# Patient Record
Sex: Female | Born: 1950 | Hispanic: No | Marital: Married | Smoking: Never smoker
Health system: Southern US, Community
[De-identification: ages and names within clinical notes are randomized; demographics above are authoritative.]

## PROBLEM LIST (undated history)

## (undated) DIAGNOSIS — B019 Varicella without complication: Secondary | ICD-10-CM

## (undated) DIAGNOSIS — C210 Malignant neoplasm of anus, unspecified: Secondary | ICD-10-CM

## (undated) HISTORY — DX: Varicella without complication: B01.9

## (undated) HISTORY — DX: Malignant neoplasm of anus, unspecified: C21.0

## (undated) HISTORY — PX: TUBAL LIGATION: SHX77

---

## 2000-06-19 ENCOUNTER — Other Ambulatory Visit: Admission: RE | Admit: 2000-06-19 | Discharge: 2000-06-19 | Payer: Self-pay | Admitting: Internal Medicine

## 2013-11-25 ENCOUNTER — Encounter: Payer: Self-pay | Admitting: Physician Assistant

## 2013-11-25 ENCOUNTER — Ambulatory Visit (INDEPENDENT_AMBULATORY_CARE_PROVIDER_SITE_OTHER): Payer: BC Managed Care – PPO | Admitting: Physician Assistant

## 2013-11-25 VITALS — BP 102/72 | HR 73 | Temp 97.7°F | Ht 62.0 in | Wt 130.0 lb

## 2013-11-25 DIAGNOSIS — Z136 Encounter for screening for cardiovascular disorders: Secondary | ICD-10-CM

## 2013-11-25 DIAGNOSIS — Z0000 Encounter for general adult medical examination without abnormal findings: Secondary | ICD-10-CM

## 2013-11-25 DIAGNOSIS — Z23 Encounter for immunization: Secondary | ICD-10-CM

## 2013-11-25 NOTE — Patient Instructions (Signed)
It was nice to meet you and your family today.  Labs have been ordered. Please be fasting when you arrive to lab.  Referral for Mammogram, GYN and colonoscopy have been placed.    Health Maintenance, Female A healthy lifestyle and preventative care can promote health and wellness.  Maintain regular health, dental, and eye exams.  Eat a healthy diet. Foods like vegetables, fruits, whole grains, low-fat dairy products, and lean protein foods contain the nutrients you need without too many calories. Decrease your intake of foods high in solid fats, added sugars, and salt. Get information about a proper diet from your caregiver, if necessary.  Regular physical exercise is one of the most important things you can do for your health. Most adults should get at least 150 minutes of moderate-intensity exercise (any activity that increases your heart rate and causes you to sweat) each week. In addition, most adults need muscle-strengthening exercises on 2 or more days a week.   Maintain a healthy weight. The body mass index (BMI) is a screening tool to identify possible weight problems. It provides an estimate of body fat based on height and weight. Your caregiver can help determine your BMI, and can help you achieve or maintain a healthy weight. For adults 20 years and older:  A BMI below 18.5 is considered underweight.  A BMI of 18.5 to 24.9 is normal.  A BMI of 25 to 29.9 is considered overweight.  A BMI of 30 and above is considered obese.  Maintain normal blood lipids and cholesterol by exercising and minimizing your intake of saturated fat. Eat a balanced diet with plenty of fruits and vegetables. Blood tests for lipids and cholesterol should begin at age 65 and be repeated every 5 years. If your lipid or cholesterol levels are high, you are over 50, or you are a high risk for heart disease, you may need your cholesterol levels checked more frequently.Ongoing high lipid and cholesterol  levels should be treated with medicines if diet and exercise are not effective.  If you smoke, find out from your caregiver how to quit. If you do not use tobacco, do not start.  Lung cancer screening is recommended for adults aged 20 80 years who are at high risk for developing lung cancer because of a history of smoking. Yearly low-dose computed tomography (CT) is recommended for people who have at least a 30-pack-year history of smoking and are a current smoker or have quit within the past 15 years. A pack year of smoking is smoking an average of 1 pack of cigarettes a day for 1 year (for example: 1 pack a day for 30 years or 2 packs a day for 15 years). Yearly screening should continue until the smoker has stopped smoking for at least 15 years. Yearly screening should also be stopped for people who develop a health problem that would prevent them from having lung cancer treatment.  If you are pregnant, do not drink alcohol. If you are breastfeeding, be very cautious about drinking alcohol. If you are not pregnant and choose to drink alcohol, do not exceed 1 drink per day. One drink is considered to be 12 ounces (355 mL) of beer, 5 ounces (148 mL) of wine, or 1.5 ounces (44 mL) of liquor.  Avoid use of street drugs. Do not share needles with anyone. Ask for help if you need support or instructions about stopping the use of drugs.  High blood pressure causes heart disease and increases the risk of  stroke. Blood pressure should be checked at least every 1 to 2 years. Ongoing high blood pressure should be treated with medicines, if weight loss and exercise are not effective.  If you are 47 to 62 years old, ask your caregiver if you should take aspirin to prevent strokes.  Diabetes screening involves taking a blood sample to check your fasting blood sugar level. This should be done once every 3 years, after age 66, if you are within normal weight and without risk factors for diabetes. Testing should be  considered at a younger age or be carried out more frequently if you are overweight and have at least 1 risk factor for diabetes.  Breast cancer screening is essential preventative care for women. You should practice "breast self-awareness." This means understanding the normal appearance and feel of your breasts and may include breast self-examination. Any changes detected, no matter how small, should be reported to a caregiver. Women in their 66s and 30s should have a clinical breast exam (CBE) by a caregiver as part of a regular health exam every 1 to 3 years. After age 68, women should have a CBE every year. Starting at age 58, women should consider having a mammogram (breast X-ray) every year. Women who have a family history of breast cancer should talk to their caregiver about genetic screening. Women at a high risk of breast cancer should talk to their caregiver about having an MRI and a mammogram every year.  Breast cancer gene (BRCA)-related cancer risk assessment is recommended for women who have family members with BRCA-related cancers. BRCA-related cancers include breast, ovarian, tubal, and peritoneal cancers. Having family members with these cancers may be associated with an increased risk for harmful changes (mutations) in the breast cancer genes BRCA1 and BRCA2. Results of the assessment will determine the need for genetic counseling and BRCA1 and BRCA2 testing.  The Pap test is a screening test for cervical cancer. Women should have a Pap test starting at age 70. Between ages 7 and 37, Pap tests should be repeated every 2 years. Beginning at age 89, you should have a Pap test every 3 years as long as the past 3 Pap tests have been normal. If you had a hysterectomy for a problem that was not cancer or a condition that could lead to cancer, then you no longer need Pap tests. If you are between ages 65 and 61, and you have had normal Pap tests going back 10 years, you no longer need Pap tests. If  you have had past treatment for cervical cancer or a condition that could lead to cancer, you need Pap tests and screening for cancer for at least 20 years after your treatment. If Pap tests have been discontinued, risk factors (such as a new sexual partner) need to be reassessed to determine if screening should be resumed. Some women have medical problems that increase the chance of getting cervical cancer. In these cases, your caregiver may recommend more frequent screening and Pap tests.  The human papillomavirus (HPV) test is an additional test that may be used for cervical cancer screening. The HPV test looks for the virus that can cause the cell changes on the cervix. The cells collected during the Pap test can be tested for HPV. The HPV test could be used to screen women aged 70 years and older, and should be used in women of any age who have unclear Pap test results. After the age of 38, women should have HPV testing  at the same frequency as a Pap test.  Colorectal cancer can be detected and often prevented. Most routine colorectal cancer screening begins at the age of 74 and continues through age 38. However, your caregiver may recommend screening at an earlier age if you have risk factors for colon cancer. On a yearly basis, your caregiver may provide home test kits to check for hidden blood in the stool. Use of a small camera at the end of a tube, to directly examine the colon (sigmoidoscopy or colonoscopy), can detect the earliest forms of colorectal cancer. Talk to your caregiver about this at age 54, when routine screening begins. Direct examination of the colon should be repeated every 5 to 10 years through age 40, unless early forms of pre-cancerous polyps or small growths are found.  Hepatitis C blood testing is recommended for all people born from [birth year] through 1964 and any individual with known risks for hepatitis C.  Practice safe sex. Use condoms and avoid high-risk sexual practices to  reduce the spread of sexually transmitted infections (STIs). Sexually active women aged 85 and younger should be checked for Chlamydia, which is a common sexually transmitted infection. Older women with new or multiple partners should also be tested for Chlamydia. Testing for other STIs is recommended if you are sexually active and at increased risk.  Osteoporosis is a disease in which the bones lose minerals and strength with aging. This can result in serious bone fractures. The risk of osteoporosis can be identified using a bone density scan. Women ages 53 and over and women at risk for fractures or osteoporosis should discuss screening with their caregivers. Ask your caregiver whether you should be taking a calcium supplement or vitamin D to reduce the rate of osteoporosis.  Menopause can be associated with physical symptoms and risks. Hormone replacement therapy is available to decrease symptoms and risks. You should talk to your caregiver about whether hormone replacement therapy is right for you.  Use sunscreen. Apply sunscreen liberally and repeatedly throughout the day. You should seek shade when your shadow is shorter than you. Protect yourself by wearing long sleeves, pants, a wide-brimmed hat, and sunglasses year round, whenever you are outdoors.  Notify your caregiver of new moles or changes in moles, especially if there is a change in shape or color. Also notify your caregiver if a mole is larger than the size of a pencil eraser.  Stay current with your immunizations. Document Released: 05/08/2011 Document Revised: 02/17/2013 Document Reviewed: 05/08/2011 [facility] Patient Information 2014 [place].

## 2013-11-26 NOTE — Progress Notes (Signed)
Subjective:    Patient ID: Anna Lawrence, female    DOB: 04/25/1951, 63 y.o.   MRN: [id number]  HPI Comments: Patient is a 63 year old female who presents to the office today to establish care. Patient is accompanied by her son who is Optometrist for encounter. Patient is from [place]. Patient reports no concerns today, would like to have referral for gynecology and have labs done. Works as a Biomedical scientist at [employer].  Patient denies chronic diagnosis or use of medications. Denies recent history of fever, chest pain, palpitations, LE swelling, cough, SOB, headaches, lightheaded/dizziness, visual change/disturbance or change in bowel/bladder habits.    Review of Systems  Constitutional: Negative for fever and chills.  HENT: Negative for trouble swallowing.   Eyes: Negative for pain and visual disturbance.  Respiratory: Negative for cough and shortness of breath.   Cardiovascular: Negative for chest pain, palpitations and leg swelling.  Genitourinary: Negative.   Musculoskeletal: Positive for myalgias (left arm intermittent when lifting plates at work.).  Skin: Negative for rash.  Neurological: Negative for dizziness, weakness, light-headedness and headaches.  All other systems reviewed and are negative.      Objective:   Physical Exam  Constitutional: Vital signs are normal. She appears well-developed and well-nourished. No distress.  HENT:  Head: Normocephalic and atraumatic.  Right Ear: Tympanic membrane, external ear and ear canal normal.  Left Ear: Tympanic membrane, external ear and ear canal normal.  Nose: Nose normal.  Mouth/Throat: Uvula is midline, oropharynx is clear and moist and mucous membranes are normal.  Unable to hear finger rubbing bilateral - at baseline per patient.  Eyes: Conjunctivae and EOM are normal. Pupils are equal, round, and reactive to light.  Neck: Trachea normal and normal range of motion. No thyromegaly present.  Cardiovascular: Normal rate  and regular rhythm.   Pulses:      Radial pulses are 2+ on the right side, and 2+ on the left side.       Dorsalis pedis pulses are 2+ on the right side, and 2+ on the left side.  Pulmonary/Chest: Breath sounds normal. She has no wheezes. She has no rhonchi. She has no rales.  Abdominal: Soft. Normal appearance and bowel sounds are normal. There is no hepatosplenomegaly. There is no tenderness.  Genitourinary:  Deferred to GYN  Musculoskeletal:  FROM U/LE bilateral  Lymphadenopathy:    She has no cervical adenopathy.       Right: No supraclavicular adenopathy present.       Left: No supraclavicular adenopathy present.  Neurological: She is alert. She has normal strength. No cranial nerve deficit. Coordination and gait normal.  Skin: Skin is warm and dry.  Psychiatric: She has a normal mood and affect. Her speech is normal and behavior is normal.   History reviewed. No pertinent past medical history. History reviewed. No pertinent family history. History   Social History  . Marital Status: Married    Spouse Name: N/A    Number of Children: N/A  . Years of Education: N/A   Social History Main Topics  . Smoking status: Never Smoker   . Smokeless tobacco: None  . Alcohol Use: No  . Drug Use: No  . Sexual Activity: None   Other Topics Concern  . None   Social History Narrative  . None    Assessment & Plan:    CPX/v70.0 - Patient has been counseled on age-appropriate routine health concerns for screening and prevention. These are reviewed and up-to-date. Immunizations  are up-to-date or declined. Labs ordered and will be reviewed.   Health Maintenance: Influenza vaccine today Tetanus vaccine today Referral to gastroenterology for colonoscopy Referral to GYN per patient request Mammogram

## 2013-12-18 ENCOUNTER — Ambulatory Visit
Admission: RE | Admit: 2013-12-18 | Discharge: 2013-12-18 | Disposition: A | Discharge status: Home / Self Care | Payer: BC Managed Care – PPO | Source: Ambulatory Visit | Attending: Physician Assistant | Admitting: Physician Assistant

## 2013-12-18 DIAGNOSIS — Z0000 Encounter for general adult medical examination without abnormal findings: Secondary | ICD-10-CM

## 2014-01-15 ENCOUNTER — Encounter: Payer: BC Managed Care – PPO | Admitting: Obstetrics & Gynecology

## 2015-04-26 ENCOUNTER — Ambulatory Visit: Payer: Self-pay | Admitting: Family

## 2015-06-21 ENCOUNTER — Telehealth: Payer: Self-pay | Admitting: Behavioral Health

## 2015-06-21 NOTE — Telephone Encounter (Signed)
Unable to reach patient at time of Pre-Visit Call.  Left message for patient to return call when available.    

## 2015-06-22 ENCOUNTER — Ambulatory Visit (INDEPENDENT_AMBULATORY_CARE_PROVIDER_SITE_OTHER): Payer: 59 | Admitting: Physician Assistant

## 2015-06-22 ENCOUNTER — Encounter: Payer: Self-pay | Admitting: Physician Assistant

## 2015-06-22 ENCOUNTER — Ambulatory Visit (HOSPITAL_BASED_OUTPATIENT_CLINIC_OR_DEPARTMENT_OTHER)
Admission: RE | Admit: 2015-06-22 | Discharge: 2015-06-22 | Disposition: A | Discharge status: Home / Self Care | Payer: 59 | Source: Ambulatory Visit | Attending: Physician Assistant | Admitting: Physician Assistant

## 2015-06-22 VITALS — BP 140/80 | HR 90 | Temp 98.2°F | Ht 62.0 in | Wt 131.6 lb

## 2015-06-22 DIAGNOSIS — R1900 Intra-abdominal and pelvic swelling, mass and lump, unspecified site: Secondary | ICD-10-CM

## 2015-06-22 DIAGNOSIS — Z1211 Encounter for screening for malignant neoplasm of colon: Secondary | ICD-10-CM | POA: Diagnosis not present

## 2015-06-22 DIAGNOSIS — H9191 Unspecified hearing loss, right ear: Secondary | ICD-10-CM | POA: Diagnosis not present

## 2015-06-22 DIAGNOSIS — R2242 Localized swelling, mass and lump, left lower limb: Secondary | ICD-10-CM | POA: Diagnosis present

## 2015-06-22 DIAGNOSIS — M47896 Other spondylosis, lumbar region: Secondary | ICD-10-CM | POA: Insufficient documentation

## 2015-06-22 DIAGNOSIS — R42 Dizziness and giddiness: Secondary | ICD-10-CM

## 2015-06-22 NOTE — Progress Notes (Signed)
Pre visit review using our clinic review tool, if applicable. No additional management support is needed unless otherwise documented below in the visit note. 

## 2015-06-22 NOTE — Progress Notes (Signed)
   Patient presents to clinic today to establish care.  Acute Concerns: Patient notes and episode of lightheadedness last week that occurred when trying to get out of bed. States she felt lightheaded, hot and sweaty. Had to sit still for about 5 minutes for lightheadedness to subside. Denies dizziness, fever, chills, nausea, vision change or AMS. Denies having another episode of this issue.  Patient also complains of a hard knot in her left pelvic region that "comes and goes". States it is noticed with prolonged standing. Denies history of hernia. Denies pain or tenderness.  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  Past Medical History  Diagnosis Date  . Chicken pox     Past Surgical History  Procedure Laterality Date  . Tubal ligation      No current outpatient prescriptions on file prior to visit.   No current facility-administered medications on file prior to visit.    No Known Allergies  Family History  Problem Relation Age of Onset  . Diabetes Mother   . Hypertension Mother     Social History   Social History  . Marital Status: Married    Spouse Name: N/A  . Number of Children: N/A  . Years of Education: N/A   Occupational History  . Not on file.   Social History Main Topics  . Smoking status: Never Smoker   . Smokeless tobacco: Never Used  . Alcohol Use: No  . Drug Use: No  . Sexual Activity: Not Currently   Other Topics Concern  . Not on file   Social History Narrative   Review of Systems  Constitutional: Negative for fever and malaise/fatigue.  Eyes: Negative for blurred vision and double vision.  Respiratory: Negative for shortness of breath.   Cardiovascular: Negative for chest pain and palpitations.  Gastrointestinal: Negative for abdominal pain.  Musculoskeletal: Negative for myalgias.  Neurological: Negative for dizziness, sensory change, speech change, focal weakness and loss of  consciousness.    BP 140/80 mmHg  Pulse 90  Temp(Src) 98.2 F (36.8 C) (Oral)  Ht 5\' 2"  (1.575 m)  Wt 131 lb 9.6 oz (59.693 kg)  BMI 24.06 kg/m2  SpO2 98%  Physical Exam  Constitutional: She is oriented to person, place, and time and well-developed, well-nourished, and in no distress.  HENT:  Head: Normocephalic and atraumatic.  Eyes: Conjunctivae are normal. Pupils are equal, round, and reactive to light.  Neck: Neck supple.  Cardiovascular: Normal rate, regular rhythm, normal heart sounds and intact distal pulses.   Pulmonary/Chest: Effort normal and breath sounds normal. No respiratory distress. She has no wheezes. She has no rales. She exhibits no tenderness.  Abdominal:    Neurological: She is alert and oriented to person, place, and time.  Skin: Skin is warm and dry. No rash noted.  Psychiatric: Affect normal.  Vitals reviewed.    Assessment/Plan: Colon cancer screening Referral to GI placed for screening colonoscopy as patient overdue.  Episodic lightheadedness One episode. Resting BP stable in clinic. OVS +. Increase fluid intake, recommend Propel once daily. Discussed slow rising and position changes. Will follow-up at CPE.  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  Pelvic mass in female Odd feeling on palpation. Some concern for hernia, but mass feels almost like bony abnormality of pelvic bone. Will start with x-ray to assess.

## 2015-06-22 NOTE — Patient Instructions (Addendum)
Please go downstairs for x-ray. I will call you with your results. You will be contacted for a screening colonoscopy and hearing evaluation. Stay well hydrated and eat a well-balanced diet. Add a gatorade daily to diet to jeep blood pressure stable. Your exam and vitals look good today. Hopefully this was a one-time episode.  Follow-up ASAP for a physical. syncope. Special exercises or compression stockings may be recommended. In rare cases, the surgical placement of a pacemaker is considered.  Near-Syncope Near-syncope (commonly known as near fainting) is sudden weakness, dizziness, or feeling like you might pass out. This can happen when getting up or while standing for a long time. It is caused by a sudden decrease in blood flow to the brain, which can occur for various reasons. Most of the reasons are not serious.  HOME CARE Watch your condition for any changes.  Have someone stay with you until you feel stable.  If you feel like you are going to pass out:  Lie down right away.  Prop your feet up if you can.  Breathe deeply and steadily.  Move only when the feeling has gone away. Most of the time, this feeling lasts only a few minutes. You may feel tired for several hours.  Drink enough fluids to keep your pee (urine) clear or pale yellow.  If you are taking blood pressure or heart medicine, stand up slowly.  Follow up with your doctor as told. GET HELP RIGHT AWAY IF:   You have a severe headache.  You have unusual pain in the chest, belly (abdomen), or back.  You have bleeding from the mouth or butt (rectum), or you have black or tarry poop (stool).  You feel your heart beat differently than normal, or you have a very fast pulse.  You pass out, or you twitch and shake when you pass out.  You pass out when sitting or lying down.  You feel confused.  You have trouble walking.  You are weak.  You have vision problems. MAKE SURE YOU:   Understand these  instructions.  Will watch your condition.  Will get help right away if you are not doing well or get worse. Document Released: 04/10/2008 Document Revised: 10/28/2013 Document Reviewed: 03/28/2013 [facility] Patient Information 2015 [place], [place]. This information is not intended to replace advice given to you by your health care provider. Make sure you discuss any questions you have with your health care provider.

## 2015-06-23 ENCOUNTER — Telehealth: Payer: Self-pay | Admitting: Physician Assistant

## 2015-06-23 NOTE — Telephone Encounter (Signed)
pre visit letter mailed 06/16/15  °

## 2015-06-26 DIAGNOSIS — H9191 Unspecified hearing loss, right ear: Secondary | ICD-10-CM | POA: Insufficient documentation

## 2015-06-26 DIAGNOSIS — R1900 Intra-abdominal and pelvic swelling, mass and lump, unspecified site: Secondary | ICD-10-CM | POA: Insufficient documentation

## 2015-06-26 DIAGNOSIS — R42 Dizziness and giddiness: Secondary | ICD-10-CM | POA: Insufficient documentation

## 2015-06-26 DIAGNOSIS — Z1211 Encounter for screening for malignant neoplasm of colon: Secondary | ICD-10-CM | POA: Insufficient documentation

## 2015-06-26 NOTE — Assessment & Plan Note (Signed)
Referral to GI placed for screening colonoscopy as patient overdue.

## 2015-06-26 NOTE — Assessment & Plan Note (Signed)
One episode. Resting BP stable in clinic. OVS +. Increase fluid intake, recommend Propel once daily. Discussed slow rising and position changes. Will follow-up at CPE.

## 2015-06-26 NOTE — Assessment & Plan Note (Signed)
Odd feeling on palpation. Some concern for hernia, but mass feels almost like bony abnormality of pelvic bone. Will start with x-ray to assess.

## 2015-06-26 NOTE — Assessment & Plan Note (Signed)
Referral to Audiology placed

## 2015-07-02 ENCOUNTER — Telehealth: Payer: Self-pay | Admitting: *Deleted

## 2015-07-02 ENCOUNTER — Encounter: Payer: Self-pay | Admitting: *Deleted

## 2015-07-02 NOTE — Telephone Encounter (Signed)
-----   Message from [name], PA-C sent at 06/23/2015  7:46 AM EDT ----- Patient needs translator service to receive results. X-ray unremarkable meaning hard mass is not bony in nature. Recommend CT abdomen/pelvis to further assess this mass. Let me know if patient is willing and I will order.

## 2015-07-07 ENCOUNTER — Ambulatory Visit (INDEPENDENT_AMBULATORY_CARE_PROVIDER_SITE_OTHER): Payer: 59 | Admitting: Physician Assistant

## 2015-07-07 ENCOUNTER — Encounter: Payer: Self-pay | Admitting: Physician Assistant

## 2015-07-07 VITALS — BP 124/78 | HR 67 | Temp 97.6°F | Resp 16 | Ht 62.0 in | Wt 129.5 lb

## 2015-07-07 DIAGNOSIS — Z1239 Encounter for other screening for malignant neoplasm of breast: Secondary | ICD-10-CM | POA: Diagnosis not present

## 2015-07-07 DIAGNOSIS — R8290 Unspecified abnormal findings in urine: Secondary | ICD-10-CM | POA: Diagnosis not present

## 2015-07-07 DIAGNOSIS — R1900 Intra-abdominal and pelvic swelling, mass and lump, unspecified site: Secondary | ICD-10-CM

## 2015-07-07 DIAGNOSIS — Z0000 Encounter for general adult medical examination without abnormal findings: Secondary | ICD-10-CM

## 2015-07-07 LAB — COMPREHENSIVE METABOLIC PANEL
ALBUMIN: 4.2 g/dL (ref 3.5–5.2)
ALK PHOS: 52 U/L (ref 39–117)
ALT: 14 U/L (ref 0–35)
AST: 19 U/L (ref 0–37)
BILIRUBIN TOTAL: 0.6 mg/dL (ref 0.2–1.2)
BUN: 17 mg/dL (ref 6–23)
CALCIUM: 9.3 mg/dL (ref 8.4–10.5)
CO2: 29 mEq/L (ref 19–32)
CREATININE: 0.91 mg/dL (ref 0.40–1.20)
Chloride: 107 mEq/L (ref 96–112)
GFR: 66.38 mL/min (ref >60.00)
Glucose, Bld: 88 mg/dL (ref 70–99)
Potassium: 4.1 mEq/L (ref 3.5–5.1)
SODIUM: 142 meq/L (ref 135–145)
TOTAL PROTEIN: 7.5 g/dL (ref 6.0–8.3)

## 2015-07-07 LAB — LIPID PANEL
Cholesterol: 172 mg/dL (ref 0–200)
HDL: 48.9 mg/dL (ref >39.00)
LDL CALC: 108 mg/dL — AB (ref 0–99)
NONHDL: 123.55
Total CHOL/HDL Ratio: 4
Triglycerides: 77 mg/dL (ref 0.0–149.0)
VLDL: 15.4 mg/dL (ref 0.0–40.0)

## 2015-07-07 LAB — URINALYSIS, ROUTINE W REFLEX MICROSCOPIC
Bilirubin Urine: NEGATIVE
Ketones, ur: NEGATIVE
Leukocytes, UA: NEGATIVE
Nitrite: NEGATIVE
PH: 6 (ref 5.0–8.0)
SPECIFIC GRAVITY, URINE: 1.01 (ref 1.000–1.030)
TOTAL PROTEIN, URINE-UPE24: NEGATIVE
UROBILINOGEN UA: 0.2 (ref 0.0–1.0)
Urine Glucose: NEGATIVE

## 2015-07-07 LAB — CBC
HCT: 35.7 % — ABNORMAL LOW (ref 36.0–46.0)
Hemoglobin: 11.8 g/dL — ABNORMAL LOW (ref 12.0–15.0)
MCHC: 33 g/dL (ref 30.0–36.0)
MCV: 90.7 fl (ref 78.0–100.0)
PLATELETS: 196 10*3/uL (ref 150.0–400.0)
RBC: 3.93 Mil/uL (ref 3.87–5.11)
RDW: 13 % (ref 11.5–15.5)
WBC: 2.8 10*3/uL — AB (ref 4.0–10.5)

## 2015-07-07 LAB — VITAMIN D 25 HYDROXY (VIT D DEFICIENCY, FRACTURES): VITD: 29.64 ng/mL — ABNORMAL LOW (ref 30.00–100.00)

## 2015-07-07 LAB — HEPATITIS C ANTIBODY: HCV Ab: NEGATIVE

## 2015-07-07 NOTE — Addendum Note (Signed)
Addended by: Raiford Noble on: 07/07/2015 07:56 AM   Modules accepted: Orders, SmartSet

## 2015-07-07 NOTE — Assessment & Plan Note (Signed)
Order for screening mammogram placed.  

## 2015-07-07 NOTE — Assessment & Plan Note (Signed)
Will proceed with CT pelvis.

## 2015-07-07 NOTE — Assessment & Plan Note (Signed)
Depression screen negative. Health Maintenance reviewed -- Will obtain Hep C screening. Colonoscopy order placed. Declines Zostavax. Will get screening mammogram. FLu shot given.. Preventive schedule discussed and handout given in AVS. Will obtain fasting labs today.

## 2015-07-07 NOTE — Progress Notes (Signed)
Patient presents to clinic today for annual exam.  Patient is fasting for labs. Translator present for entirety of visit.  Acute Concerns: None today per patient.  Chronic Issues: Pelvic Mass -- X-ray unremarkable. Is still present after prolonged walking or standing. Patient denies new or worsening symptoms.  Health Maintenance: Dental -- up-to-date Vision -- up-to-date Immunizations -- Flu shot will be given today. Declines Zostavax. Colonoscopy -- Referral placed at last visit for screening colonoscopy. Mammogram -- Last in 12/2013. Due for repeat. Will order.   Past Medical History  Diagnosis Date  . Chicken pox     Past Surgical History  Procedure Laterality Date  . Tubal ligation      Current Outpatient Prescriptions on File Prior to Visit  Medication Sig Dispense Refill  . Omega-3 Fatty Acids (FISH OIL PO) Take by mouth. Take 1 capsule by mouth 2 times daily.     No current facility-administered medications on file prior to visit.    No Known Allergies  Family History  Problem Relation Age of Onset  . Diabetes Mother   . Hypertension Mother     Social History   Social History  . Marital Status: Married    Spouse Name: N/A  . Number of Children: N/A  . Years of Education: N/A   Occupational History  . Not on file.   Social History Main Topics  . Smoking status: Never Smoker   . Smokeless tobacco: Never Used  . Alcohol Use: No  . Drug Use: No  . Sexual Activity: Not Currently   Other Topics Concern  . Not on file   Social History Narrative   Review of Systems  Constitutional: Negative for fever and weight loss.  HENT: Positive for hearing loss. Negative for ear discharge, ear pain and tinnitus.   Eyes: Negative for blurred vision, double vision, photophobia and pain.  Respiratory: Negative for cough and shortness of breath.   Cardiovascular: Negative for chest pain and palpitations.  Gastrointestinal: Negative for heartburn, nausea,  vomiting, abdominal pain, diarrhea, constipation, blood in stool and melena.  Genitourinary: Negative for dysuria, urgency, frequency, hematuria and flank pain.  Musculoskeletal: Negative for falls.  Neurological: Negative for dizziness, loss of consciousness and headaches.  Endo/Heme/Allergies: Negative for environmental allergies.  Psychiatric/Behavioral: Negative for depression, suicidal ideas, hallucinations and substance abuse. The patient is not nervous/anxious and does not have insomnia.    BP 124/78 mmHg  Pulse 67  Temp(Src) 97.6 F (36.4 C) (Oral)  Resp 16  Ht 5\' 2"  (1.575 m)  Wt 129 lb 8 oz (58.741 kg)  BMI 23.68 kg/m2  SpO2 99%  Physical Exam  Constitutional: She is oriented to person, place, and time and well-developed, well-nourished, and in no distress.  HENT:  Head: Normocephalic and atraumatic.  Right Ear: External ear normal.  Left Ear: External ear normal.  Nose: Nose normal.  Mouth/Throat: Oropharynx is clear and moist. No oropharyngeal exudate.  TM within normal limits bilaterally.  Eyes: Conjunctivae are normal.  Neck: Neck supple.  Cardiovascular: Normal rate, regular rhythm, normal heart sounds and intact distal pulses.   Pulmonary/Chest: Effort normal and breath sounds normal. No respiratory distress. She has no wheezes. She has no rales. She exhibits no tenderness.  Neurological: She is alert and oriented to person, place, and time.  Skin: Skin is warm and dry. No rash noted.  Psychiatric: Affect normal.  Vitals reviewed.  Assessment/Plan: Visit for preventive health examination Depression screen negative. Health Maintenance reviewed -- Will obtain  Hep C screening. Colonoscopy order placed. Declines Zostavax. Will get screening mammogram. FLu shot given.. Preventive schedule discussed and handout given in AVS. Will obtain fasting labs today.   Pelvic mass in female Will proceed with CT pelvis.  Breast cancer screening Order for screening  mammogram placed.

## 2015-07-07 NOTE — Addendum Note (Signed)
Addended by: Harl Bowie on: 07/07/2015 03:00 PM   Modules accepted: Orders

## 2015-07-07 NOTE — Addendum Note (Signed)
Addended by: Raiford Noble on: 07/07/2015 08:44 AM   Modules accepted: Orders, SmartSet

## 2015-07-07 NOTE — Patient Instructions (Signed)
Please go to the lab for blood work.  I will call you with your results. If your blood work is normal we will follow-up yearly for physicals.  If anything is abnormal we will treat you and get you back in for a visit.  Please keep your phone on:   -- You will be called for mammogram, colonoscopy, hearing assessment and for your CT scan.  Preventive Care for Adults A healthy lifestyle and preventive care can promote health and wellness. Preventive health guidelines for women include the following key practices.  A routine yearly physical is a good way to check with your health care provider about your health and preventive screening. It is a chance to share any concerns and updates on your health and to receive a thorough exam.  Visit your dentist for a routine exam and preventive care every 6 months. Brush your teeth twice a day and floss once a day. Good oral hygiene prevents tooth decay and gum disease.  The frequency of eye exams is based on your age, health, family medical history, use of contact lenses, and other factors. Follow your health care provider's recommendations for frequency of eye exams.  Eat a healthy diet. Foods like vegetables, fruits, whole grains, low-fat dairy products, and lean protein foods contain the nutrients you need without too many calories. Decrease your intake of foods high in solid fats, added sugars, and salt. Eat the right amount of calories for you.Get information about a proper diet from your health care provider, if necessary.  Regular physical exercise is one of the most important things you can do for your health. Most adults should get at least 150 minutes of moderate-intensity exercise (any activity that increases your heart rate and causes you to sweat) each week. In addition, most adults need muscle-strengthening exercises on 2 or more days a week.  Maintain a healthy weight. The body mass index (BMI) is a screening tool to identify possible weight  problems. It provides an estimate of body fat based on height and weight. Your health care provider can find your BMI and can help you achieve or maintain a healthy weight.For adults 20 years and older:  A BMI below 18.5 is considered underweight.  A BMI of 18.5 to 24.9 is normal.  A BMI of 25 to 29.9 is considered overweight.  A BMI of 30 and above is considered obese.  Maintain normal blood lipids and cholesterol levels by exercising and minimizing your intake of saturated fat. Eat a balanced diet with plenty of fruit and vegetables. Blood tests for lipids and cholesterol should begin at age 58 and be repeated every 5 years. If your lipid or cholesterol levels are high, you are over 50, or you are at high risk for heart disease, you may need your cholesterol levels checked more frequently.Ongoing high lipid and cholesterol levels should be treated with medicines if diet and exercise are not working.  If you smoke, find out from your health care provider how to quit. If you do not use tobacco, do not start.  Lung cancer screening is recommended for adults aged 35-80 years who are at high risk for developing lung cancer because of a history of smoking. A yearly low-dose CT scan of the lungs is recommended for people who have at least a 30-pack-year history of smoking and are a current smoker or have quit within the past 15 years. A pack year of smoking is smoking an average of 1 pack of cigarettes a day  for 1 year (for example: 1 pack a day for 30 years or 2 packs a day for 15 years). Yearly screening should continue until the smoker has stopped smoking for at least 15 years. Yearly screening should be stopped for people who develop a health problem that would prevent them from having lung cancer treatment.  If you are pregnant, do not drink alcohol. If you are breastfeeding, be very cautious about drinking alcohol. If you are not pregnant and choose to drink alcohol, do not have more than 1 drink  per day. One drink is considered to be 12 ounces (355 mL) of beer, 5 ounces (148 mL) of wine, or 1.5 ounces (44 mL) of liquor.  Avoid use of street drugs. Do not share needles with anyone. Ask for help if you need support or instructions about stopping the use of drugs.  High blood pressure causes heart disease and increases the risk of stroke. Your blood pressure should be checked at least every 1 to 2 years. Ongoing high blood pressure should be treated with medicines if weight loss and exercise do not work.  If you are 79-73 years old, ask your health care provider if you should take aspirin to prevent strokes.  Diabetes screening involves taking a blood sample to check your fasting blood sugar level. This should be done once every 3 years, after age 28, if you are within normal weight and without risk factors for diabetes. Testing should be considered at a younger age or be carried out more frequently if you are overweight and have at least 1 risk factor for diabetes.  Breast cancer screening is essential preventive care for women. You should practice "breast self-awareness." This means understanding the normal appearance and feel of your breasts and may include breast self-examination. Any changes detected, no matter how small, should be reported to a health care provider. Women in their 56s and 30s should have a clinical breast exam (CBE) by a health care provider as part of a regular health exam every 1 to 3 years. After age 2, women should have a CBE every year. Starting at age 15, women should consider having a mammogram (breast X-ray test) every year. Women who have a family history of breast cancer should talk to their health care provider about genetic screening. Women at a high risk of breast cancer should talk to their health care providers about having an MRI and a mammogram every year.  Breast cancer gene (BRCA)-related cancer risk assessment is recommended for women who have family  members with BRCA-related cancers. BRCA-related cancers include breast, ovarian, tubal, and peritoneal cancers. Having family members with these cancers may be associated with an increased risk for harmful changes (mutations) in the breast cancer genes BRCA1 and BRCA2. Results of the assessment will determine the need for genetic counseling and BRCA1 and BRCA2 testing.  Routine pelvic exams to screen for cancer are no longer recommended for nonpregnant women who are considered low risk for cancer of the pelvic organs (ovaries, uterus, and vagina) and who do not have symptoms. Ask your health care provider if a screening pelvic exam is right for you.  If you have had past treatment for cervical cancer or a condition that could lead to cancer, you need Pap tests and screening for cancer for at least 20 years after your treatment. If Pap tests have been discontinued, your risk factors (such as having a new sexual partner) need to be reassessed to determine if screening should be resumed.  Some women have medical problems that increase the chance of getting cervical cancer. In these cases, your health care provider may recommend more frequent screening and Pap tests.  The HPV test is an additional test that may be used for cervical cancer screening. The HPV test looks for the virus that can cause the cell changes on the cervix. The cells collected during the Pap test can be tested for HPV. The HPV test could be used to screen women aged 63 years and older, and should be used in women of any age who have unclear Pap test results. After the age of 42, women should have HPV testing at the same frequency as a Pap test.  Colorectal cancer can be detected and often prevented. Most routine colorectal cancer screening begins at the age of 74 years and continues through age 82 years. However, your health care provider may recommend screening at an earlier age if you have risk factors for colon cancer. On a yearly basis,  your health care provider may provide home test kits to check for hidden blood in the stool. Use of a small camera at the end of a tube, to directly examine the colon (sigmoidoscopy or colonoscopy), can detect the earliest forms of colorectal cancer. Talk to your health care provider about this at age 53, when routine screening begins. Direct exam of the colon should be repeated every 5-10 years through age 8 years, unless early forms of pre-cancerous polyps or small growths are found.  People who are at an increased risk for hepatitis B should be screened for this virus. You are considered at high risk for hepatitis B if:  You were born in a country where hepatitis B occurs often. Talk with your health care provider about which countries are considered high risk.  Your parents were born in a high-risk country and you have not received a shot to protect against hepatitis B (hepatitis B vaccine).  You have HIV or AIDS.  You use needles to inject street drugs.  You live with, or have sex with, someone who has hepatitis B.  You get hemodialysis treatment.  You take certain medicines for conditions like cancer, organ transplantation, and autoimmune conditions.  Hepatitis C blood testing is recommended for all people born from [birth year] through 1964 and any individual with known risks for hepatitis C.  Practice safe sex. Use condoms and avoid high-risk sexual practices to reduce the spread of sexually transmitted infections (STIs). STIs include gonorrhea, chlamydia, syphilis, trichomonas, herpes, HPV, and human immunodeficiency virus (HIV). Herpes, HIV, and HPV are viral illnesses that have no cure. They can result in disability, cancer, and death.  You should be screened for sexually transmitted illnesses (STIs) including gonorrhea and chlamydia if:  You are sexually active and are younger than 24 years.  You are older than 24 years and your health care provider tells you that you are at risk for  this type of infection.  Your sexual activity has changed since you were last screened and you are at an increased risk for chlamydia or gonorrhea. Ask your health care provider if you are at risk.  If you are at risk of being infected with HIV, it is recommended that you take a prescription medicine daily to prevent HIV infection. This is called preexposure prophylaxis (PrEP). You are considered at risk if:  You are a heterosexual woman, are sexually active, and are at increased risk for HIV infection.  You take drugs by injection.  You are  sexually active with a partner who has HIV.  Talk with your health care provider about whether you are at high risk of being infected with HIV. If you choose to begin PrEP, you should first be tested for HIV. You should then be tested every 3 months for as long as you are taking PrEP.  Osteoporosis is a disease in which the bones lose minerals and strength with aging. This can result in serious bone fractures or breaks. The risk of osteoporosis can be identified using a bone density scan. Women ages 38 years and over and women at risk for fractures or osteoporosis should discuss screening with their health care providers. Ask your health care provider whether you should take a calcium supplement or vitamin D to reduce the rate of osteoporosis.  Menopause can be associated with physical symptoms and risks. Hormone replacement therapy is available to decrease symptoms and risks. You should talk to your health care provider about whether hormone replacement therapy is right for you.  Use sunscreen. Apply sunscreen liberally and repeatedly throughout the day. You should seek shade when your shadow is shorter than you. Protect yourself by wearing long sleeves, pants, a wide-brimmed hat, and sunglasses year round, whenever you are outdoors.  Once a month, do a whole body skin exam, using a mirror to look at the skin on your back. Tell your health care provider of  new moles, moles that have irregular borders, moles that are larger than a pencil eraser, or moles that have changed in shape or color.  Stay current with required vaccines (immunizations).  Influenza vaccine. All adults should be immunized every year.  Tetanus, diphtheria, and acellular pertussis (Td, Tdap) vaccine. Pregnant women should receive 1 dose of Tdap vaccine during each pregnancy. The dose should be obtained regardless of the length of time since the last dose. Immunization is preferred during the 27th-36th week of gestation. An adult who has not previously received Tdap or who does not know her vaccine status should receive 1 dose of Tdap. This initial dose should be followed by tetanus and diphtheria toxoids (Td) booster doses every 10 years. Adults with an unknown or incomplete history of completing a 3-dose immunization series with Td-containing vaccines should begin or complete a primary immunization series including a Tdap dose. Adults should receive a Td booster every 10 years.  Varicella vaccine. An adult without evidence of immunity to varicella should receive 2 doses or a second dose if she has previously received 1 dose. Pregnant females who do not have evidence of immunity should receive the first dose after pregnancy. This first dose should be obtained before leaving the health care facility. The second dose should be obtained 4-8 weeks after the first dose.  Human papillomavirus (HPV) vaccine. Females aged 13-26 years who have not received the vaccine previously should obtain the 3-dose series. The vaccine is not recommended for use in pregnant females. However, pregnancy testing is not needed before receiving a dose. If a female is found to be pregnant after receiving a dose, no treatment is needed. In that case, the remaining doses should be delayed until after the pregnancy. Immunization is recommended for any person with an immunocompromised condition through the age of 61  years if she did not get any or all doses earlier. During the 3-dose series, the second dose should be obtained 4-8 weeks after the first dose. The third dose should be obtained 24 weeks after the first dose and 16 weeks after the second dose.  Zoster vaccine. One dose is recommended for adults aged 55 years or older unless certain conditions are present.  Measles, mumps, and rubella (MMR) vaccine. Adults born before [birth year] generally are considered immune to measles and mumps. Adults born in [birth year] or later should have 1 or more doses of MMR vaccine unless there is a contraindication to the vaccine or there is laboratory evidence of immunity to each of the three diseases. A routine second dose of MMR vaccine should be obtained at least 28 days after the first dose for students attending postsecondary schools, health care workers, or international travelers. People who received inactivated measles vaccine or an unknown type of measles vaccine during 1963-1967 should receive 2 doses of MMR vaccine. People who received inactivated mumps vaccine or an unknown type of mumps vaccine before 1979 and are at high risk for mumps infection should consider immunization with 2 doses of MMR vaccine. For females of childbearing age, rubella immunity should be determined. If there is no evidence of immunity, females who are not pregnant should be vaccinated. If there is no evidence of immunity, females who are pregnant should delay immunization until after pregnancy. Unvaccinated health care workers born before [birth year] who lack laboratory evidence of measles, mumps, or rubella immunity or laboratory confirmation of disease should consider measles and mumps immunization with 2 doses of MMR vaccine or rubella immunization with 1 dose of MMR vaccine.  Pneumococcal 13-valent conjugate (PCV13) vaccine. When indicated, a person who is uncertain of her immunization history and has no record of immunization should receive the PCV13 vaccine.  An adult aged 4 years or older who has certain medical conditions and has not been previously immunized should receive 1 dose of PCV13 vaccine. This PCV13 should be followed with a dose of pneumococcal polysaccharide (PPSV23) vaccine. The PPSV23 vaccine dose should be obtained at least 8 weeks after the dose of PCV13 vaccine. An adult aged 66 years or older who has certain medical conditions and previously received 1 or more doses of PPSV23 vaccine should receive 1 dose of PCV13. The PCV13 vaccine dose should be obtained 1 or more years after the last PPSV23 vaccine dose.  Pneumococcal polysaccharide (PPSV23) vaccine. When PCV13 is also indicated, PCV13 should be obtained first. All adults aged 27 years and older should be immunized. An adult younger than age 43 years who has certain medical conditions should be immunized. Any person who resides in a nursing home or long-term care facility should be immunized. An adult smoker should be immunized. People with an immunocompromised condition and certain other conditions should receive both PCV13 and PPSV23 vaccines. People with human immunodeficiency virus (HIV) infection should be immunized as soon as possible after diagnosis. Immunization during chemotherapy or radiation therapy should be avoided. Routine use of PPSV23 vaccine is not recommended for American Indians, [place] Natives, or people younger than 65 years unless there are medical conditions that require PPSV23 vaccine. When indicated, people who have unknown immunization and have no record of immunization should receive PPSV23 vaccine. One-time revaccination 5 years after the first dose of PPSV23 is recommended for people aged 19-64 years who have chronic kidney failure, nephrotic syndrome, asplenia, or immunocompromised conditions. People who received 1-2 doses of PPSV23 before age 31 years should receive another dose of PPSV23 vaccine at age 68 years or later if at least 5 years have passed since the  previous dose. Doses of PPSV23 are not needed for people immunized with PPSV23 at or after age 26 years.  Meningococcal  vaccine. Adults with asplenia or persistent complement component deficiencies should receive 2 doses of quadrivalent meningococcal conjugate (MenACWY-D) vaccine. The doses should be obtained at least 2 months apart. Microbiologists working with certain meningococcal bacteria, [employer] recruits, people at risk during an outbreak, and people who travel to or live in countries with a high rate of meningitis should be immunized. A first-year college student up through age 60 years who is living in a residence hall should receive a dose if she did not receive a dose on or after her 16th birthday. Adults who have certain high-risk conditions should receive one or more doses of vaccine.  Hepatitis A vaccine. Adults who wish to be protected from this disease, have certain high-risk conditions, work with hepatitis A-infected animals, work in hepatitis A research labs, or travel to or work in countries with a high rate of hepatitis A should be immunized. Adults who were previously unvaccinated and who anticipate close contact with an international adoptee during the first 60 days after arrival in the [place] from a country with a high rate of hepatitis A should be immunized.  Hepatitis B vaccine. Adults who wish to be protected from this disease, have certain high-risk conditions, may be exposed to blood or other infectious body fluids, are household contacts or sex partners of hepatitis B positive people, are clients or workers in certain care facilities, or travel to or work in countries with a high rate of hepatitis B should be immunized.  Haemophilus influenzae type b (Hib) vaccine. A previously unvaccinated person with asplenia or sickle cell disease or having a scheduled splenectomy should receive 1 dose of Hib vaccine. Regardless of previous immunization, a recipient of a hematopoietic  stem cell transplant should receive a 3-dose series 6-12 months after her successful transplant. Hib vaccine is not recommended for adults with HIV infection. Preventive Services / Frequency Ages 71 to 15 years  Blood pressure check.** / Every 1 to 2 years.  Lipid and cholesterol check.** / Every 5 years beginning at age 19.  Clinical breast exam.** / Every 3 years for women in their 64s and 79s.  BRCA-related cancer risk assessment.** / For women who have family members with a BRCA-related cancer (breast, ovarian, tubal, or peritoneal cancers).  Pap test.** / Every 2 years from ages 59 through 4. Every 3 years starting at age 42 through age 59 or 35 with a history of 3 consecutive normal Pap tests.  HPV screening.** / Every 3 years from ages 63 through ages 68 to 43 with a history of 3 consecutive normal Pap tests.  Hepatitis C blood test.** / For any individual with known risks for hepatitis C.  Skin self-exam. / Monthly.  Influenza vaccine. / Every year.  Tetanus, diphtheria, and acellular pertussis (Tdap, Td) vaccine.** / Consult your health care provider. Pregnant women should receive 1 dose of Tdap vaccine during each pregnancy. 1 dose of Td every 10 years.  Varicella vaccine.** / Consult your health care provider. Pregnant females who do not have evidence of immunity should receive the first dose after pregnancy.  HPV vaccine. / 3 doses over 6 months, if 33 and younger. The vaccine is not recommended for use in pregnant females. However, pregnancy testing is not needed before receiving a dose.  Measles, mumps, rubella (MMR) vaccine.** / You need at least 1 dose of MMR if you were born in 1957 or later. You may also need a 2nd dose. For females of childbearing age, rubella immunity should be  determined. If there is no evidence of immunity, females who are not pregnant should be vaccinated. If there is no evidence of immunity, females who are pregnant should delay immunization until  after pregnancy.  Pneumococcal 13-valent conjugate (PCV13) vaccine.** / Consult your health care provider.  Pneumococcal polysaccharide (PPSV23) vaccine.** / 1 to 2 doses if you smoke cigarettes or if you have certain conditions.  Meningococcal vaccine.** / 1 dose if you are age 49 to 62 years and a Market researcher living in a residence hall, or have one of several medical conditions, you need to get vaccinated against meningococcal disease. You may also need additional booster doses.  Hepatitis A vaccine.** / Consult your health care provider.  Hepatitis B vaccine.** / Consult your health care provider.  Haemophilus influenzae type b (Hib) vaccine.** / Consult your health care provider. Ages 23 to 39 years  Blood pressure check.** / Every 1 to 2 years.  Lipid and cholesterol check.** / Every 5 years beginning at age 27 years.  Lung cancer screening. / Every year if you are aged 4-80 years and have a 30-pack-year history of smoking and currently smoke or have quit within the past 15 years. Yearly screening is stopped once you have quit smoking for at least 15 years or develop a health problem that would prevent you from having lung cancer treatment.  Clinical breast exam.** / Every year after age 59 years.  BRCA-related cancer risk assessment.** / For women who have family members with a BRCA-related cancer (breast, ovarian, tubal, or peritoneal cancers).  Mammogram.** / Every year beginning at age 5 years and continuing for as long as you are in good health. Consult with your health care provider.  Pap test.** / Every 3 years starting at age 64 years through age 66 or 85 years with a history of 3 consecutive normal Pap tests.  HPV screening.** / Every 3 years from ages 1 years through ages 46 to 34 years with a history of 3 consecutive normal Pap tests.  Fecal occult blood test (FOBT) of stool. / Every year beginning at age 68 years and continuing until age 25 years.  You may not need to do this test if you get a colonoscopy every 10 years.  Flexible sigmoidoscopy or colonoscopy.** / Every 5 years for a flexible sigmoidoscopy or every 10 years for a colonoscopy beginning at age 30 years and continuing until age 39 years.  Hepatitis C blood test.** / For all people born from [birth year] through 1964 and any individual with known risks for hepatitis C.  Skin self-exam. / Monthly.  Influenza vaccine. / Every year.  Tetanus, diphtheria, and acellular pertussis (Tdap/Td) vaccine.** / Consult your health care provider. Pregnant women should receive 1 dose of Tdap vaccine during each pregnancy. 1 dose of Td every 10 years.  Varicella vaccine.** / Consult your health care provider. Pregnant females who do not have evidence of immunity should receive the first dose after pregnancy.  Zoster vaccine.** / 1 dose for adults aged 8 years or older.  Measles, mumps, rubella (MMR) vaccine.** / You need at least 1 dose of MMR if you were born in 1957 or later. You may also need a 2nd dose. For females of childbearing age, rubella immunity should be determined. If there is no evidence of immunity, females who are not pregnant should be vaccinated. If there is no evidence of immunity, females who are pregnant should delay immunization until after pregnancy.  Pneumococcal 13-valent conjugate (PCV13) vaccine.** /  Consult your health care provider.  Pneumococcal polysaccharide (PPSV23) vaccine.** / 1 to 2 doses if you smoke cigarettes or if you have certain conditions.  Meningococcal vaccine.** / Consult your health care provider.  Hepatitis A vaccine.** / Consult your health care provider.  Hepatitis B vaccine.** / Consult your health care provider.  Haemophilus influenzae type b (Hib) vaccine.** / Consult your health care provider. Ages 46 years and over  Blood pressure check.** / Every 1 to 2 years.  Lipid and cholesterol check.** / Every 5 years beginning at age 49  years.  Lung cancer screening. / Every year if you are aged 40-80 years and have a 30-pack-year history of smoking and currently smoke or have quit within the past 15 years. Yearly screening is stopped once you have quit smoking for at least 15 years or develop a health problem that would prevent you from having lung cancer treatment.  Clinical breast exam.** / Every year after age 8 years.  BRCA-related cancer risk assessment.** / For women who have family members with a BRCA-related cancer (breast, ovarian, tubal, or peritoneal cancers).  Mammogram.** / Every year beginning at age 16 years and continuing for as long as you are in good health. Consult with your health care provider.  Pap test.** / Every 3 years starting at age 87 years through age 38 or 30 years with 3 consecutive normal Pap tests. Testing can be stopped between 65 and 70 years with 3 consecutive normal Pap tests and no abnormal Pap or HPV tests in the past 10 years.  HPV screening.** / Every 3 years from ages 14 years through ages 23 or 16 years with a history of 3 consecutive normal Pap tests. Testing can be stopped between 65 and 70 years with 3 consecutive normal Pap tests and no abnormal Pap or HPV tests in the past 10 years.  Fecal occult blood test (FOBT) of stool. / Every year beginning at age 77 years and continuing until age 22 years. You may not need to do this test if you get a colonoscopy every 10 years.  Flexible sigmoidoscopy or colonoscopy.** / Every 5 years for a flexible sigmoidoscopy or every 10 years for a colonoscopy beginning at age 51 years and continuing until age 10 years.  Hepatitis C blood test.** / For all people born from [birth year] through 1964 and any individual with known risks for hepatitis C.  Osteoporosis screening.** / A one-time screening for women ages 86 years and over and women at risk for fractures or osteoporosis.  Skin self-exam. / Monthly.  Influenza vaccine. / Every year.  Tetanus,  diphtheria, and acellular pertussis (Tdap/Td) vaccine.** / 1 dose of Td every 10 years.  Varicella vaccine.** / Consult your health care provider.  Zoster vaccine.** / 1 dose for adults aged 35 years or older.  Pneumococcal 13-valent conjugate (PCV13) vaccine.** / Consult your health care provider.  Pneumococcal polysaccharide (PPSV23) vaccine.** / 1 dose for all adults aged 35 years and older.  Meningococcal vaccine.** / Consult your health care provider.  Hepatitis A vaccine.** / Consult your health care provider.  Hepatitis B vaccine.** / Consult your health care provider.  Haemophilus influenzae type b (Hib) vaccine.** / Consult your health care provider. ** Family history and personal history of risk and conditions may change your health care provider's recommendations. Document Released: 12/19/2001 Document Revised: 03/09/2014 Document Reviewed: 03/20/2011 [facility] Patient Information 2015 [place], [place]. This information is not intended to replace advice given to you by your health  care provider. Make sure you discuss any questions you have with your health care provider.

## 2015-07-07 NOTE — Progress Notes (Signed)
Pre visit review using our clinic review tool, if applicable. No additional management support is needed unless otherwise documented below in the visit note/SLS  

## 2015-07-08 NOTE — Telephone Encounter (Signed)
Pt was seen in the office on (07/07/15) for CPE, and was given x-ray results and CT ordered.//AB/CMA

## 2015-07-10 LAB — URINE CULTURE

## 2015-07-16 ENCOUNTER — Telehealth: Payer: Self-pay | Admitting: *Deleted

## 2015-07-16 MED ORDER — NITROFURANTOIN MONOHYD MACRO 100 MG PO CAPS
100.0000 mg | ORAL_CAPSULE | Freq: Two times a day (BID) | ORAL | Status: DC
Start: 2015-07-16 — End: 2015-09-17

## 2015-07-16 NOTE — Telephone Encounter (Signed)
Notes Recorded by Rockwell Germany, CMA on 07/13/2015 at 7:59 AM [facility] with contact name and number for return call RE: results and further provider instructions/SLS

## 2015-07-16 NOTE — Telephone Encounter (Signed)
LMOM [2nd] with contact name and number for return call RE: detailed message of results and further provider instructions; did state that medication was being sent to her pharmacy, as it is the weekend & we have yet to reach patient via telephone/SLS

## 2015-07-16 NOTE — Telephone Encounter (Signed)
-----   Message from [name], PA-C sent at 07/12/2015  8:56 PM EDT ----- Labs are great overall. Her Vitamin D is just under the normal range. Nothing needed but 10-15 minutes of sunlight each day! Her urine showed signs of infection so we sent for culture that confirmed a UTI. Will need to treat. Ok to send in Rx for Macrobid 100 mg BID x 5 days. Quantity 10 with 0 refills.

## 2015-07-19 ENCOUNTER — Encounter: Payer: Self-pay | Admitting: *Deleted

## 2015-07-20 NOTE — Telephone Encounter (Signed)
We have made attempts to reach patient by phone unsuccessfully; Letter Mailed/SLS

## 2015-07-26 ENCOUNTER — Ambulatory Visit (HOSPITAL_BASED_OUTPATIENT_CLINIC_OR_DEPARTMENT_OTHER): Payer: 59

## 2015-09-17 ENCOUNTER — Encounter: Payer: Self-pay | Admitting: Physician Assistant

## 2015-09-17 ENCOUNTER — Ambulatory Visit (INDEPENDENT_AMBULATORY_CARE_PROVIDER_SITE_OTHER): Payer: 59 | Admitting: Physician Assistant

## 2015-09-17 VITALS — BP 153/80 | HR 78 | Temp 97.9°F | Resp 16 | Ht 62.0 in | Wt 133.2 lb

## 2015-09-17 DIAGNOSIS — K648 Other hemorrhoids: Secondary | ICD-10-CM | POA: Diagnosis not present

## 2015-09-17 MED ORDER — HYDROCORTISONE ACETATE 25 MG RE SUPP: 25.0000 mg | Freq: Two times a day (BID) | RECTAL | Status: DC

## 2015-09-17 NOTE — Assessment & Plan Note (Signed)
Palpable on exam. Grade 2 suspected. Non-thrombosed. Rx Hydrocortisone rectal suppositories BID x 6 days. Dietary measures and fiber supplement discussed. Follow-up if not resolving.

## 2015-09-17 NOTE — Progress Notes (Signed)
Pre visit review using our clinic review tool, if applicable. No additional management support is needed unless otherwise documented below in the visit note/SLS  

## 2015-09-17 NOTE — Progress Notes (Signed)
Patient presents to clinic today c/o lump inside rectum that is pruritic but not painful. Endorses some mild discomfort with bowel movements. Endorses one episode of bright red blood when wiping. Denies melena. Endorses some mild constipation recently.  Past Medical History  Diagnosis Date  . Chicken pox     Current Outpatient Prescriptions on File Prior to Visit  Medication Sig Dispense Refill  . Omega-3 Fatty Acids (FISH OIL PO) Take by mouth. Take 1 capsule by mouth 2 times daily.     No current facility-administered medications on file prior to visit.    No Known Allergies  Family History  Problem Relation Age of Onset  . Diabetes Mother   . Hypertension Mother     Social History   Social History  . Marital Status: Married    Spouse Name: N/A  . Number of Children: N/A  . Years of Education: N/A   Social History Main Topics  . Smoking status: Never Smoker   . Smokeless tobacco: Never Used  . Alcohol Use: No  . Drug Use: No  . Sexual Activity: Not Currently   Other Topics Concern  . None   Social History Narrative   Review of Systems - See HPI.  All other ROS are negative.  BP 153/80 mmHg  Pulse 78  Temp(Src) 97.9 F (36.6 C) (Oral)  Resp 16  Ht _0  (1.575 m)  Wt 133 lb 4 oz (60.442 kg)  BMI 24.37 kg/m2  SpO2 100%  Physical Exam  Constitutional: She is oriented to person, place, and time and well-developed, well-nourished, and in no distress.  HENT:  Head: Normocephalic and atraumatic.  Cardiovascular: Normal rate, regular rhythm, normal heart sounds and intact distal pulses.   Pulmonary/Chest: Effort normal.  Genitourinary: Rectal exam shows internal hemorrhoid. Rectal exam shows no external hemorrhoid and no fissure. Guaiac negative stool.  Chaperone present for examination  Neurological: She is alert and oriented to person, place, and time.  Skin: Skin is warm and dry. No rash noted.  Vitals reviewed.   Recent Results (from the past 2160  hour(s))  CBC     Status: Abnormal   Collection Time: 07/07/15  7:57 AM  Result Value Ref Range   WBC 2.8 (L) 4.0 - 10.5 K/uL   RBC 3.93 3.87 - 5.11 Mil/uL   Platelets 196.0 150.0 - 400.0 K/uL   Hemoglobin 11.8 (L) 12.0 - 15.0 g/dL   HCT 35.7 (L) 36.0 - 46.0 %   MCV 90.7 78.0 - 100.0 fl   MCHC 33.0 30.0 - 36.0 g/dL   RDW 13.0 11.5 - 15.5 %  Comp Met (CMET)     Status: None   Collection Time: 07/07/15  7:57 AM  Result Value Ref Range   Sodium 142 135 - 145 mEq/L   Potassium 4.1 3.5 - 5.1 mEq/L   Chloride 107 96 - 112 mEq/L   CO2 29 19 - 32 mEq/L   Glucose, Bld 88 70 - 99 mg/dL   BUN 17 6 - 23 mg/dL   Creatinine, Ser 0.91 0.40 - 1.20 mg/dL   Total Bilirubin 0.6 0.2 - 1.2 mg/dL   Alkaline Phosphatase 52 39 - 117 U/L   AST 19 0 - 37 U/L   ALT 14 0 - 35 U/L   Total Protein 7.5 6.0 - 8.3 g/dL   Albumin 4.2 3.5 - 5.2 g/dL   Calcium 9.3 8.4 - 10.5 mg/dL   GFR 66.38 >60.00 mL/min  Urinalysis, Routine w reflex microscopic (  not at [facility])     Status: Abnormal   Collection Time: 07/07/15  7:57 AM  Result Value Ref Range   Color, Urine YELLOW Yellow;Lt. Yellow   APPearance CLEAR Clear   Specific Gravity, Urine 1.010 1.000-1.030   pH 6.0 5.0 - 8.0   Total Protein, Urine NEGATIVE Negative   Urine Glucose NEGATIVE Negative   Ketones, ur NEGATIVE Negative   Bilirubin Urine NEGATIVE Negative   Hgb urine dipstick MODERATE (A) Negative   Urobilinogen, UA 0.2 0.0 - 1.0   Leukocytes, UA NEGATIVE Negative   Nitrite NEGATIVE Negative   WBC, UA 0-2/hpf 0-2/hpf   RBC / HPF 3-6/hpf (A) 0-2/hpf   Squamous Epithelial / LPF Rare(0-4/hpf) Rare(0-4/hpf)   Bacteria, UA Few(10-50/hpf) (A) None  Lipid Profile     Status: Abnormal   Collection Time: 07/07/15  7:57 AM  Result Value Ref Range   Cholesterol 172 0 - 200 mg/dL    Comment: ATP III Classification       Desirable:  < 200 mg/dL               Borderline High:  200 - 239 mg/dL          High:  > = 240 mg/dL   Triglycerides 77.0 0.0 - 149.0 mg/dL     Comment: Normal:  <150 mg/dLBorderline High:  150 - 199 mg/dL   HDL 48.90 >39.00 mg/dL   VLDL 15.4 0.0 - 40.0 mg/dL   LDL Cholesterol 108 (H) 0 - 99 mg/dL   Total CHOL/HDL Ratio 4     Comment:                Men          Women1/2 Average Risk     3.4          3.3Average Risk          5.0          4.42X Average Risk          9.6          7.13X Average Risk          15.0          11.0                       NonHDL 123.55     Comment: NOTE:  Non-HDL goal should be 30 mg/dL higher than patient's LDL goal (i.e. LDL goal of < 70 mg/dL, would have non-HDL goal of < 100 mg/dL)  Vitamin D (25 hydroxy)     Status: Abnormal   Collection Time: 07/07/15  7:57 AM  Result Value Ref Range   VITD 29.64 (L) 30.00 - 100.00 ng/mL  Hepatitis C Antibody     Status: None   Collection Time: 07/07/15  7:57 AM  Result Value Ref Range   HCV Ab NEGATIVE NEGATIVE  Urine Culture     Status: None   Collection Time: 07/07/15  3:00 PM  Result Value Ref Range   Culture ESCHERICHIA COLI    Colony Count >=100,000 COLONIES/ML    Organism ID, Bacteria ESCHERICHIA COLI       Susceptibility   Escherichia coli -  (no method available)    AMPICILLIN >=32 Resistant     AMOX/CLAVULANIC >=32 Resistant     AMPICILLIN/SULBACTAM >=32 Resistant     PIP/TAZO 64 Intermediate     IMIPENEM <=0.25 Sensitive     CEFAZOLIN 8 Resistant  CEFTRIAXONE <=1 Sensitive     CEFTAZIDIME <=1 Sensitive     CEFEPIME <=1 Sensitive     GENTAMICIN <=1 Sensitive     TOBRAMYCIN <=1 Sensitive     CIPROFLOXACIN <=0.25 Sensitive     LEVOFLOXACIN <=0.12 Sensitive     NITROFURANTOIN <=16 Sensitive     TRIMETH/SULFA* >=320 Resistant      * ORAL therapy:A cefazolin MIC of <32 predicts susceptibility to the oral agents cefaclor,cefdinir,cefpodoxime,cefprozil,cefuroxime,cephalexin,and loracarbef when used for therapy of uncomplicated UTIs due to E.coli,K.pneumomiae,and P.mirabilis. PARENTERAL therapy: A cefazolinMIC of >8 indicates resistance to  parenteralcefazolin. An alternate test method must beperformed to confirm susceptibility to parenteralcefazolin.    Assessment/Plan: Internal hemorrhoids Palpable on exam. Grade 2 suspected. Non-thrombosed. Rx Hydrocortisone rectal suppositories BID x 6 days. Dietary measures and fiber supplement discussed. Follow-up if not resolving.

## 2015-09-17 NOTE — Patient Instructions (Signed)
Please use rectal suppositories as directed for 6 days to shrink hemorrhoid. Increase fluids and start a fiber supplement daily. You can use wet wipes to help with wiping after bowel movements.  Call or come see me if symptoms are not resolving. We will need to set you up with a GI specialist.

## 2015-10-14 ENCOUNTER — Telehealth: Payer: Self-pay | Admitting: Physician Assistant

## 2015-10-14 NOTE — Telephone Encounter (Signed)
LM to schedule flu shot or update records.

## 2015-12-09 NOTE — Telephone Encounter (Signed)
LM for pt to call and schedule flu shot or update records. °

## 2016-07-10 ENCOUNTER — Encounter: Payer: Self-pay | Admitting: Family Medicine

## 2016-08-02 ENCOUNTER — Encounter: Payer: Self-pay | Admitting: Physician Assistant

## 2016-08-02 ENCOUNTER — Ambulatory Visit (INDEPENDENT_AMBULATORY_CARE_PROVIDER_SITE_OTHER): Payer: BLUE CROSS/BLUE SHIELD | Admitting: Physician Assistant

## 2016-08-02 VITALS — BP 141/87 | HR 79 | Temp 98.2°F | Resp 16 | Ht 62.0 in | Wt 129.2 lb

## 2016-08-02 DIAGNOSIS — Z0000 Encounter for general adult medical examination without abnormal findings: Secondary | ICD-10-CM

## 2016-08-02 DIAGNOSIS — Z23 Encounter for immunization: Secondary | ICD-10-CM

## 2016-08-02 DIAGNOSIS — Z1211 Encounter for screening for malignant neoplasm of colon: Secondary | ICD-10-CM

## 2016-08-02 DIAGNOSIS — Z1239 Encounter for other screening for malignant neoplasm of breast: Secondary | ICD-10-CM

## 2016-08-02 LAB — COMPREHENSIVE METABOLIC PANEL
ALBUMIN: 4.3 g/dL (ref 3.5–5.2)
ALK PHOS: 49 U/L (ref 39–117)
ALT: 14 U/L (ref 0–35)
AST: 19 U/L (ref 0–37)
BUN: 20 mg/dL (ref 6–23)
CO2: 27 mEq/L (ref 19–32)
CREATININE: 0.97 mg/dL (ref 0.40–1.20)
Calcium: 9.4 mg/dL (ref 8.4–10.5)
Chloride: 106 mEq/L (ref 96–112)
GFR: 61.46 mL/min (ref >60.00)
GLUCOSE: 80 mg/dL (ref 70–99)
POTASSIUM: 4.5 meq/L (ref 3.5–5.1)
SODIUM: 141 meq/L (ref 135–145)
TOTAL PROTEIN: 7.9 g/dL (ref 6.0–8.3)
Total Bilirubin: 0.4 mg/dL (ref 0.2–1.2)

## 2016-08-02 LAB — CBC
HEMATOCRIT: 35.5 % — AB (ref 36.0–46.0)
Hemoglobin: 12 g/dL (ref 12.0–15.0)
MCHC: 33.7 g/dL (ref 30.0–36.0)
MCV: 88.5 fl (ref 78.0–100.0)
Platelets: 215 10*3/uL (ref 150.0–400.0)
RBC: 4.02 Mil/uL (ref 3.87–5.11)
RDW: 13 % (ref 11.5–15.5)
WBC: 3.8 10*3/uL — AB (ref 4.0–10.5)

## 2016-08-02 LAB — TSH: TSH: 1.77 u[IU]/mL (ref 0.35–4.50)

## 2016-08-02 LAB — LIPID PANEL
CHOL/HDL RATIO: 4
Cholesterol: 191 mg/dL (ref 0–200)
HDL: 50.4 mg/dL (ref >39.00)
LDL Cholesterol: 123 mg/dL — ABNORMAL HIGH (ref 0–99)
NONHDL: 140.1
Triglycerides: 88 mg/dL (ref 0.0–149.0)
VLDL: 17.6 mg/dL (ref 0.0–40.0)

## 2016-08-02 LAB — URINALYSIS, ROUTINE W REFLEX MICROSCOPIC
Bilirubin Urine: NEGATIVE
Ketones, ur: NEGATIVE
Nitrite: POSITIVE — AB
PH: 6 (ref 5.0–8.0)
Total Protein, Urine: NEGATIVE
Urine Glucose: NEGATIVE
Urobilinogen, UA: 0.2 (ref 0.0–1.0)

## 2016-08-02 LAB — HEMOGLOBIN A1C: HEMOGLOBIN A1C: 5.7 % (ref 4.6–6.5)

## 2016-08-02 NOTE — Progress Notes (Signed)
Patient presents to clinic today for annual exam.  Patient is fasting for labs. Body mass index is 23.64 kg/m. Patient is eating a well-balanced diet and staying well hydrated. Is staying very active.    Health Maintenance: Immunizations -- Requests flu shot today.  Colonoscopy -- overdue. Mammogram -- overdue  Past Medical History:  Diagnosis Date  . Chicken pox     Past Surgical History:  Procedure Laterality Date  . TUBAL LIGATION      Current Outpatient Prescriptions on File Prior to Visit  Medication Sig Dispense Refill  . Omega-3 Fatty Acids (FISH OIL PO) Take by mouth. Take 1 capsule by mouth 2 times daily.     No current facility-administered medications on file prior to visit.     No Known Allergies  Family History  Problem Relation Age of Onset  . Diabetes Mother   . Hypertension Mother     Social History   Social History  . Marital status: Married    Spouse name: N/A  . Number of children: N/A  . Years of education: N/A   Occupational History  . Not on file.   Social History Main Topics  . Smoking status: Never Smoker  . Smokeless tobacco: Never Used  . Alcohol use No  . Drug use: No  . Sexual activity: Not Currently   Other Topics Concern  . Not on file   Social History Narrative  . No narrative on file   Review of Systems  Constitutional: Negative for fever and weight loss.  HENT: Negative for ear discharge, ear pain, hearing loss and tinnitus.   Eyes: Negative for blurred vision, double vision, photophobia and pain.  Respiratory: Negative for cough and shortness of breath.   Cardiovascular: Negative for chest pain and palpitations.  Gastrointestinal: Negative for abdominal pain, blood in stool, constipation, diarrhea, heartburn, melena, nausea and vomiting.  Genitourinary: Negative for dysuria, flank pain, frequency, hematuria and urgency.  Musculoskeletal: Negative for falls.  Neurological: Negative for dizziness, loss of  consciousness and headaches.  Endo/Heme/Allergies: Negative for environmental allergies.  Psychiatric/Behavioral: Negative for depression, hallucinations, substance abuse and suicidal ideas. The patient is not nervous/anxious and does not have insomnia.    BP (!) 141/87 (BP Location: Right Arm, Patient Position: Sitting, Cuff Size: Normal)   Pulse 79   Temp 98.2 F (36.8 C) (Oral)   Resp 16   Ht 5\' 2"  (1.575 m)   Wt 129 lb 4 oz (58.6 kg)   SpO2 99%   BMI 23.64 kg/m   Physical Exam  Constitutional: She is oriented to person, place, and time and well-developed, well-nourished, and in no distress.  HENT:  Head: Normocephalic and atraumatic.  Right Ear: Tympanic membrane, external ear and ear canal normal.  Left Ear: Tympanic membrane, external ear and ear canal normal.  Nose: Nose normal. No mucosal edema.  Mouth/Throat: Uvula is midline, oropharynx is clear and moist and mucous membranes are normal. No oropharyngeal exudate or posterior oropharyngeal erythema.  Eyes: Conjunctivae are normal. Pupils are equal, round, and reactive to light.  Neck: Neck supple. No thyromegaly present.  Cardiovascular: Normal rate, regular rhythm, normal heart sounds and intact distal pulses.   Pulmonary/Chest: Effort normal and breath sounds normal. No respiratory distress. She has no wheezes. She has no rales.  Abdominal: Soft. Bowel sounds are normal. She exhibits no distension and no mass. There is no tenderness. There is no rebound and no guarding.  Lymphadenopathy:    She has no cervical  adenopathy.  Neurological: She is alert and oriented to person, place, and time. No cranial nerve deficit.  Skin: Skin is warm and dry. No rash noted.  Psychiatric: Affect normal.  Vitals reviewed.  Assessment/Plan: 1. Visit for preventive health examination Depression screen negative. Health Maintenance reviewed -- Order for colonoscopy placed. Order for screening mammogram placed. Preventive schedule  discussed and handout given in AVS. Will obtain fasting labs today.  - CBC - Comprehensive metabolic panel - Hemoglobin A1c - Lipid panel - TSH - Urinalysis, Routine w reflex microscopic (not at [facility])  2. Encounter for immunization Flu shot given  3. Colon cancer screening Referral to GI placed for screening colonoscopy - Ambulatory referral to Gastroenterology  4. Breast cancer screening Order for screening mammogram placed - MM DIGITAL SCREENING BILATERAL; Future   Leeanne Rio, PA-C

## 2016-08-02 NOTE — Progress Notes (Signed)
Pre visit review using our clinic review tool, if applicable. No additional management support is needed unless otherwise documented below in the visit note/SLS  

## 2016-08-02 NOTE — Patient Instructions (Signed)
Please keep well hydrated and get plenty of rest. Eat a well-balanced diet.  Go to the lab for blood work. I will call you with your results.  Please get over-the-counter Preparation H to use for hemorrhoids. You will be contacted by Gastroenterology for further assessment and for a screening colonoscopy.  You will also be contacted by Gynecology for routine care.  Lastly, you will be contacted to schedule a screening mammogram.  Preventive Care for Adults, Female A healthy lifestyle and preventive care can promote health and wellness. Preventive health guidelines for women include the following key practices.  A routine yearly physical is a good way to check with your health care provider about your health and preventive screening. It is a chance to share any concerns and updates on your health and to receive a thorough exam.  Visit your dentist for a routine exam and preventive care every 6 months. Brush your teeth twice a day and floss once a day. Good oral hygiene prevents tooth decay and gum disease.  The frequency of eye exams is based on your age, health, family medical history, use of contact lenses, and other factors. Follow your health care provider's recommendations for frequency of eye exams.  Eat a healthy diet. Foods like vegetables, fruits, whole grains, low-fat dairy products, and lean protein foods contain the nutrients you need without too many calories. Decrease your intake of foods high in solid fats, added sugars, and salt. Eat the right amount of calories for you.Get information about a proper diet from your health care provider, if necessary.  Regular physical exercise is one of the most important things you can do for your health. Most adults should get at least 150 minutes of moderate-intensity exercise (any activity that increases your heart rate and causes you to sweat) each week. In addition, most adults need muscle-strengthening exercises on 2 or more days a  week.  Maintain a healthy weight. The body mass index (BMI) is a screening tool to identify possible weight problems. It provides an estimate of body fat based on height and weight. Your health care provider can find your BMI and can help you achieve or maintain a healthy weight.For adults 20 years and older:  A BMI below 18.5 is considered underweight.  A BMI of 18.5 to 24.9 is normal.  A BMI of 25 to 29.9 is considered overweight.  A BMI of 30 and above is considered obese.  Maintain normal blood lipids and cholesterol levels by exercising and minimizing your intake of saturated fat. Eat a balanced diet with plenty of fruit and vegetables. Blood tests for lipids and cholesterol should begin at age 5 and be repeated every 5 years. If your lipid or cholesterol levels are high, you are over 50, or you are at high risk for heart disease, you may need your cholesterol levels checked more frequently.Ongoing high lipid and cholesterol levels should be treated with medicines if diet and exercise are not working.  If you smoke, find out from your health care provider how to quit. If you do not use tobacco, do not start.  Lung cancer screening is recommended for adults aged 58-80 years who are at high risk for developing lung cancer because of a history of smoking. A yearly low-dose CT scan of the lungs is recommended for people who have at least a 30-pack-year history of smoking and are a current smoker or have quit within the past 15 years. A pack year of smoking is smoking an average  of 1 pack of cigarettes a day for 1 year (for example: 1 pack a day for 30 years or 2 packs a day for 15 years). Yearly screening should continue until the smoker has stopped smoking for at least 15 years. Yearly screening should be stopped for people who develop a health problem that would prevent them from having lung cancer treatment.  If you are pregnant, do not drink alcohol. If you are breastfeeding, be very  cautious about drinking alcohol. If you are not pregnant and choose to drink alcohol, do not have more than 1 drink per day. One drink is considered to be 12 ounces (355 mL) of beer, 5 ounces (148 mL) of wine, or 1.5 ounces (44 mL) of liquor.  Avoid use of street drugs. Do not share needles with anyone. Ask for help if you need support or instructions about stopping the use of drugs.  High blood pressure causes heart disease and increases the risk of stroke. Your blood pressure should be checked at least every 1 to 2 years. Ongoing high blood pressure should be treated with medicines if weight loss and exercise do not work.  If you are 45-64 years old, ask your health care provider if you should take aspirin to prevent strokes.  Diabetes screening is done by taking a blood sample to check your blood glucose level after you have not eaten for a certain period of time (fasting). If you are not overweight and you do not have risk factors for diabetes, you should be screened once every 3 years starting at age 97. If you are overweight or obese and you are 89-103 years of age, you should be screened for diabetes every year as part of your cardiovascular risk assessment.  Breast cancer screening is essential preventive care for women. You should practice "breast self-awareness." This means understanding the normal appearance and feel of your breasts and may include breast self-examination. Any changes detected, no matter how small, should be reported to a health care provider. Women in their 47s and 30s should have a clinical breast exam (CBE) by a health care provider as part of a regular health exam every 1 to 3 years. After age 53, women should have a CBE every year. Starting at age 17, women should consider having a mammogram (breast X-ray test) every year. Women who have a family history of breast cancer should talk to their health care provider about genetic screening. Women at a high risk of breast cancer  should talk to their health care providers about having an MRI and a mammogram every year.  Breast cancer gene (BRCA)-related cancer risk assessment is recommended for women who have family members with BRCA-related cancers. BRCA-related cancers include breast, ovarian, tubal, and peritoneal cancers. Having family members with these cancers may be associated with an increased risk for harmful changes (mutations) in the breast cancer genes BRCA1 and BRCA2. Results of the assessment will determine the need for genetic counseling and BRCA1 and BRCA2 testing.  Your health care provider may recommend that you be screened regularly for cancer of the pelvic organs (ovaries, uterus, and vagina). This screening involves a pelvic examination, including checking for microscopic changes to the surface of your cervix (Pap test). You may be encouraged to have this screening done every 3 years, beginning at age 54.  For women ages 70-65, health care providers may recommend pelvic exams and Pap testing every 3 years, or they may recommend the Pap and pelvic exam, combined with testing  for human papilloma virus (HPV), every 5 years. Some types of HPV increase your risk of cervical cancer. Testing for HPV may also be done on women of any age with unclear Pap test results.  Other health care providers may not recommend any screening for nonpregnant women who are considered low risk for pelvic cancer and who do not have symptoms. Ask your health care provider if a screening pelvic exam is right for you.  If you have had past treatment for cervical cancer or a condition that could lead to cancer, you need Pap tests and screening for cancer for at least 20 years after your treatment. If Pap tests have been discontinued, your risk factors (such as having a new sexual partner) need to be reassessed to determine if screening should resume. Some women have medical problems that increase the chance of getting cervical cancer. In  these cases, your health care provider may recommend more frequent screening and Pap tests.  Colorectal cancer can be detected and often prevented. Most routine colorectal cancer screening begins at the age of 37 years and continues through age 65 years. However, your health care provider may recommend screening at an earlier age if you have risk factors for colon cancer. On a yearly basis, your health care provider may provide home test kits to check for hidden blood in the stool. Use of a small camera at the end of a tube, to directly examine the colon (sigmoidoscopy or colonoscopy), can detect the earliest forms of colorectal cancer. Talk to your health care provider about this at age 20, when routine screening begins. Direct exam of the colon should be repeated every 5-10 years through age 64 years, unless early forms of precancerous polyps or small growths are found.  People who are at an increased risk for hepatitis B should be screened for this virus. You are considered at high risk for hepatitis B if:  You were born in a country where hepatitis B occurs often. Talk with your health care provider about which countries are considered high risk.  Your parents were born in a high-risk country and you have not received a shot to protect against hepatitis B (hepatitis B vaccine).  You have HIV or AIDS.  You use needles to inject street drugs.  You live with, or have sex with, someone who has hepatitis B.  You get hemodialysis treatment.  You take certain medicines for conditions like cancer, organ transplantation, and autoimmune conditions.  Hepatitis C blood testing is recommended for all people born from [birth year] through 1965 and any individual with known risks for hepatitis C.  Practice safe sex. Use condoms and avoid high-risk sexual practices to reduce the spread of sexually transmitted infections (STIs). STIs include gonorrhea, chlamydia, syphilis, trichomonas, herpes, HPV, and human  immunodeficiency virus (HIV). Herpes, HIV, and HPV are viral illnesses that have no cure. They can result in disability, cancer, and death.  You should be screened for sexually transmitted illnesses (STIs) including gonorrhea and chlamydia if:  You are sexually active and are younger than 24 years.  You are older than 24 years and your health care provider tells you that you are at risk for this type of infection.  Your sexual activity has changed since you were last screened and you are at an increased risk for chlamydia or gonorrhea. Ask your health care provider if you are at risk.  If you are at risk of being infected with HIV, it is recommended that you take a  prescription medicine daily to prevent HIV infection. This is called preexposure prophylaxis (PrEP). You are considered at risk if:  You are sexually active and do not regularly use condoms or know the HIV status of your partner(s).  You take drugs by injection.  You are sexually active with a partner who has HIV.  Talk with your health care provider about whether you are at high risk of being infected with HIV. If you choose to begin PrEP, you should first be tested for HIV. You should then be tested every 3 months for as long as you are taking PrEP.  Osteoporosis is a disease in which the bones lose minerals and strength with aging. This can result in serious bone fractures or breaks. The risk of osteoporosis can be identified using a bone density scan. Women ages 45 years and over and women at risk for fractures or osteoporosis should discuss screening with their health care providers. Ask your health care provider whether you should take a calcium supplement or vitamin D to reduce the rate of osteoporosis.  Menopause can be associated with physical symptoms and risks. Hormone replacement therapy is available to decrease symptoms and risks. You should talk to your health care provider about whether hormone replacement therapy is  right for you.  Use sunscreen. Apply sunscreen liberally and repeatedly throughout the day. You should seek shade when your shadow is shorter than you. Protect yourself by wearing long sleeves, pants, a wide-brimmed hat, and sunglasses year round, whenever you are outdoors.  Once a month, do a whole body skin exam, using a mirror to look at the skin on your back. Tell your health care provider of new moles, moles that have irregular borders, moles that are larger than a pencil eraser, or moles that have changed in shape or color.  Stay current with required vaccines (immunizations).  Influenza vaccine. All adults should be immunized every year.  Tetanus, diphtheria, and acellular pertussis (Td, Tdap) vaccine. Pregnant women should receive 1 dose of Tdap vaccine during each pregnancy. The dose should be obtained regardless of the length of time since the last dose. Immunization is preferred during the 27th-36th week of gestation. An adult who has not previously received Tdap or who does not know her vaccine status should receive 1 dose of Tdap. This initial dose should be followed by tetanus and diphtheria toxoids (Td) booster doses every 10 years. Adults with an unknown or incomplete history of completing a 3-dose immunization series with Td-containing vaccines should begin or complete a primary immunization series including a Tdap dose. Adults should receive a Td booster every 10 years.  Varicella vaccine. An adult without evidence of immunity to varicella should receive 2 doses or a second dose if she has previously received 1 dose. Pregnant females who do not have evidence of immunity should receive the first dose after pregnancy. This first dose should be obtained before leaving the health care facility. The second dose should be obtained 4-8 weeks after the first dose.  Human papillomavirus (HPV) vaccine. Females aged 13-26 years who have not received the vaccine previously should obtain the  3-dose series. The vaccine is not recommended for use in pregnant females. However, pregnancy testing is not needed before receiving a dose. If a female is found to be pregnant after receiving a dose, no treatment is needed. In that case, the remaining doses should be delayed until after the pregnancy. Immunization is recommended for any person with an immunocompromised condition through the age of  26 years if she did not get any or all doses earlier. During the 3-dose series, the second dose should be obtained 4-8 weeks after the first dose. The third dose should be obtained 24 weeks after the first dose and 16 weeks after the second dose.  Zoster vaccine. One dose is recommended for adults aged 40 years or older unless certain conditions are present.  Measles, mumps, and rubella (MMR) vaccine. Adults born before [birth year] generally are considered immune to measles and mumps. Adults born in [birth year] or later should have 1 or more doses of MMR vaccine unless there is a contraindication to the vaccine or there is laboratory evidence of immunity to each of the three diseases. A routine second dose of MMR vaccine should be obtained at least 28 days after the first dose for students attending postsecondary schools, health care workers, or international travelers. People who received inactivated measles vaccine or an unknown type of measles vaccine during 1963-1967 should receive 2 doses of MMR vaccine. People who received inactivated mumps vaccine or an unknown type of mumps vaccine before 1979 and are at high risk for mumps infection should consider immunization with 2 doses of MMR vaccine. For females of childbearing age, rubella immunity should be determined. If there is no evidence of immunity, females who are not pregnant should be vaccinated. If there is no evidence of immunity, females who are pregnant should delay immunization until after pregnancy. Unvaccinated health care workers born before [birth year] who lack  laboratory evidence of measles, mumps, or rubella immunity or laboratory confirmation of disease should consider measles and mumps immunization with 2 doses of MMR vaccine or rubella immunization with 1 dose of MMR vaccine.  Pneumococcal 13-valent conjugate (PCV13) vaccine. When indicated, a person who is uncertain of his immunization history and has no record of immunization should receive the PCV13 vaccine. All adults 57 years of age and older should receive this vaccine. An adult aged 64 years or older who has certain medical conditions and has not been previously immunized should receive 1 dose of PCV13 vaccine. This PCV13 should be followed with a dose of pneumococcal polysaccharide (PPSV23) vaccine. Adults who are at high risk for pneumococcal disease should obtain the PPSV23 vaccine at least 8 weeks after the dose of PCV13 vaccine. Adults older than 64 years of age who have normal immune system function should obtain the PPSV23 vaccine dose at least 1 year after the dose of PCV13 vaccine.  Pneumococcal polysaccharide (PPSV23) vaccine. When PCV13 is also indicated, PCV13 should be obtained first. All adults aged 76 years and older should be immunized. An adult younger than age 78 years who has certain medical conditions should be immunized. Any person who resides in a nursing home or long-term care facility should be immunized. An adult smoker should be immunized. People with an immunocompromised condition and certain other conditions should receive both PCV13 and PPSV23 vaccines. People with human immunodeficiency virus (HIV) infection should be immunized as soon as possible after diagnosis. Immunization during chemotherapy or radiation therapy should be avoided. Routine use of PPSV23 vaccine is not recommended for American Indians, [place] Natives, or people younger than 65 years unless there are medical conditions that require PPSV23 vaccine. When indicated, people who have unknown immunization and have  no record of immunization should receive PPSV23 vaccine. One-time revaccination 5 years after the first dose of PPSV23 is recommended for people aged 19-64 years who have chronic kidney failure, nephrotic syndrome, asplenia, or immunocompromised conditions. People who received  1-2 doses of PPSV23 before age 53 years should receive another dose of PPSV23 vaccine at age 77 years or later if at least 5 years have passed since the previous dose. Doses of PPSV23 are not needed for people immunized with PPSV23 at or after age 109 years.  Meningococcal vaccine. Adults with asplenia or persistent complement component deficiencies should receive 2 doses of quadrivalent meningococcal conjugate (MenACWY-D) vaccine. The doses should be obtained at least 2 months apart. Microbiologists working with certain meningococcal bacteria, [place] recruits, people at risk during an outbreak, and people who travel to or live in countries with a high rate of meningitis should be immunized. A first-year college student up through age 8 years who is living in a residence hall should receive a dose if she did not receive a dose on or after her 16th birthday. Adults who have certain high-risk conditions should receive one or more doses of vaccine.  Hepatitis A vaccine. Adults who wish to be protected from this disease, have certain high-risk conditions, work with hepatitis A-infected animals, work in hepatitis A research labs, or travel to or work in countries with a high rate of hepatitis A should be immunized. Adults who were previously unvaccinated and who anticipate close contact with an international adoptee during the first 60 days after arrival in the [place] from a country with a high rate of hepatitis A should be immunized.  Hepatitis B vaccine. Adults who wish to be protected from this disease, have certain high-risk conditions, may be exposed to blood or other infectious body fluids, are household contacts or sex  partners of hepatitis B positive people, are clients or workers in certain care facilities, or travel to or work in countries with a high rate of hepatitis B should be immunized.  Haemophilus influenzae type b (Hib) vaccine. A previously unvaccinated person with asplenia or sickle cell disease or having a scheduled splenectomy should receive 1 dose of Hib vaccine. Regardless of previous immunization, a recipient of a hematopoietic stem cell transplant should receive a 3-dose series 6-12 months after her successful transplant. Hib vaccine is not recommended for adults with HIV infection. Preventive Services / Frequency Ages 15 to 44 years  Blood pressure check.** / Every 3-5 years.  Lipid and cholesterol check.** / Every 5 years beginning at age 32.  Clinical breast exam.** / Every 3 years for women in their 65s and 46s.  BRCA-related cancer risk assessment.** / For women who have family members with a BRCA-related cancer (breast, ovarian, tubal, or peritoneal cancers).  Pap test.** / Every 2 years from ages 84 through 30. Every 3 years starting at age 73 through age 49 or 54 with a history of 3 consecutive normal Pap tests.  HPV screening.** / Every 3 years from ages 44 through ages 54 to 18 with a history of 3 consecutive normal Pap tests.  Hepatitis C blood test.** / For any individual with known risks for hepatitis C.  Skin self-exam. / Monthly.  Influenza vaccine. / Every year.  Tetanus, diphtheria, and acellular pertussis (Tdap, Td) vaccine.** / Consult your health care provider. Pregnant women should receive 1 dose of Tdap vaccine during each pregnancy. 1 dose of Td every 10 years.  Varicella vaccine.** / Consult your health care provider. Pregnant females who do not have evidence of immunity should receive the first dose after pregnancy.  HPV vaccine. / 3 doses over 6 months, if 33 and younger. The vaccine is not recommended for use in pregnant females.  However, pregnancy testing  is not needed before receiving a dose.  Measles, mumps, rubella (MMR) vaccine.** / You need at least 1 dose of MMR if you were born in 1957 or later. You may also need a 2nd dose. For females of childbearing age, rubella immunity should be determined. If there is no evidence of immunity, females who are not pregnant should be vaccinated. If there is no evidence of immunity, females who are pregnant should delay immunization until after pregnancy.  Pneumococcal 13-valent conjugate (PCV13) vaccine.** / Consult your health care provider.  Pneumococcal polysaccharide (PPSV23) vaccine.** / 1 to 2 doses if you smoke cigarettes or if you have certain conditions.  Meningococcal vaccine.** / 1 dose if you are age 62 to 20 years and a Market researcher living in a residence hall, or have one of several medical conditions, you need to get vaccinated against meningococcal disease. You may also need additional booster doses.  Hepatitis A vaccine.** / Consult your health care provider.  Hepatitis B vaccine.** / Consult your health care provider.  Haemophilus influenzae type b (Hib) vaccine.** / Consult your health care provider. Ages 54 to 68 years  Blood pressure check.** / Every year.  Lipid and cholesterol check.** / Every 5 years beginning at age 14 years.  Lung cancer screening. / Every year if you are aged 76-80 years and have a 30-pack-year history of smoking and currently smoke or have quit within the past 15 years. Yearly screening is stopped once you have quit smoking for at least 15 years or develop a health problem that would prevent you from having lung cancer treatment.  Clinical breast exam.** / Every year after age 60 years.  BRCA-related cancer risk assessment.** / For women who have family members with a BRCA-related cancer (breast, ovarian, tubal, or peritoneal cancers).  Mammogram.** / Every year beginning at age 59 years and continuing for as long as you are in good  health. Consult with your health care provider.  Pap test.** / Every 3 years starting at age 51 years through age 53 or 31 years with a history of 3 consecutive normal Pap tests.  HPV screening.** / Every 3 years from ages 39 years through ages 65 to 80 years with a history of 3 consecutive normal Pap tests.  Fecal occult blood test (FOBT) of stool. / Every year beginning at age 51 years and continuing until age 72 years. You may not need to do this test if you get a colonoscopy every 10 years.  Flexible sigmoidoscopy or colonoscopy.** / Every 5 years for a flexible sigmoidoscopy or every 10 years for a colonoscopy beginning at age 49 years and continuing until age 46 years.  Hepatitis C blood test.** / For all people born from [birth year] through 1965 and any individual with known risks for hepatitis C.  Skin self-exam. / Monthly.  Influenza vaccine. / Every year.  Tetanus, diphtheria, and acellular pertussis (Tdap/Td) vaccine.** / Consult your health care provider. Pregnant women should receive 1 dose of Tdap vaccine during each pregnancy. 1 dose of Td every 10 years.  Varicella vaccine.** / Consult your health care provider. Pregnant females who do not have evidence of immunity should receive the first dose after pregnancy.  Zoster vaccine.** / 1 dose for adults aged 57 years or older.  Measles, mumps, rubella (MMR) vaccine.** / You need at least 1 dose of MMR if you were born in 1957 or later. You may also need a second dose. For females of  childbearing age, rubella immunity should be determined. If there is no evidence of immunity, females who are not pregnant should be vaccinated. If there is no evidence of immunity, females who are pregnant should delay immunization until after pregnancy.  Pneumococcal 13-valent conjugate (PCV13) vaccine.** / Consult your health care provider.  Pneumococcal polysaccharide (PPSV23) vaccine.** / 1 to 2 doses if you smoke cigarettes or if you have certain  conditions.  Meningococcal vaccine.** / Consult your health care provider.  Hepatitis A vaccine.** / Consult your health care provider.  Hepatitis B vaccine.** / Consult your health care provider.  Haemophilus influenzae type b (Hib) vaccine.** / Consult your health care provider. Ages 77 years and over  Blood pressure check.** / Every year.  Lipid and cholesterol check.** / Every 5 years beginning at age 62 years.  Lung cancer screening. / Every year if you are aged 23-80 years and have a 30-pack-year history of smoking and currently smoke or have quit within the past 15 years. Yearly screening is stopped once you have quit smoking for at least 15 years or develop a health problem that would prevent you from having lung cancer treatment.  Clinical breast exam.** / Every year after age 52 years.  BRCA-related cancer risk assessment.** / For women who have family members with a BRCA-related cancer (breast, ovarian, tubal, or peritoneal cancers).  Mammogram.** / Every year beginning at age 26 years and continuing for as long as you are in good health. Consult with your health care provider.  Pap test.** / Every 3 years starting at age 73 years through age 23 or 39 years with 3 consecutive normal Pap tests. Testing can be stopped between 65 and 70 years with 3 consecutive normal Pap tests and no abnormal Pap or HPV tests in the past 10 years.  HPV screening.** / Every 3 years from ages 57 years through ages 67 or 49 years with a history of 3 consecutive normal Pap tests. Testing can be stopped between 65 and 70 years with 3 consecutive normal Pap tests and no abnormal Pap or HPV tests in the past 10 years.  Fecal occult blood test (FOBT) of stool. / Every year beginning at age 53 years and continuing until age 70 years. You may not need to do this test if you get a colonoscopy every 10 years.  Flexible sigmoidoscopy or colonoscopy.** / Every 5 years for a flexible sigmoidoscopy or every 10  years for a colonoscopy beginning at age 54 years and continuing until age 90 years.  Hepatitis C blood test.** / For all people born from [birth year] through 1964 and any individual with known risks for hepatitis C.  Osteoporosis screening.** / A one-time screening for women ages 69 years and over and women at risk for fractures or osteoporosis.  Skin self-exam. / Monthly.  Influenza vaccine. / Every year.  Tetanus, diphtheria, and acellular pertussis (Tdap/Td) vaccine.** / 1 dose of Td every 10 years.  Varicella vaccine.** / Consult your health care provider.  Zoster vaccine.** / 1 dose for adults aged 33 years or older.  Pneumococcal 13-valent conjugate (PCV13) vaccine.** / Consult your health care provider.  Pneumococcal polysaccharide (PPSV23) vaccine.** / 1 dose for all adults aged 98 years and older.  Meningococcal vaccine.** / Consult your health care provider.  Hepatitis A vaccine.** / Consult your health care provider.  Hepatitis B vaccine.** / Consult your health care provider.  Haemophilus influenzae type b (Hib) vaccine.** / Consult your health care provider. ** Family history  and personal history of risk and conditions may change your health care provider's recommendations.   This information is not intended to replace advice given to you by your health care provider. Make sure you discuss any questions you have with your health care provider.   Document Released: 12/19/2001 Document Revised: 11/13/2014 Document Reviewed: 03/20/2011 Elsevier Interactive Patient Education [employer].

## 2016-08-04 ENCOUNTER — Telehealth: Payer: Self-pay | Admitting: *Deleted

## 2016-08-04 DIAGNOSIS — Z23 Encounter for immunization: Secondary | ICD-10-CM

## 2016-08-04 DIAGNOSIS — R8290 Unspecified abnormal findings in urine: Secondary | ICD-10-CM

## 2016-08-04 MED ORDER — CEPHALEXIN 500 MG PO CAPS: 500.0000 mg | ORAL_CAPSULE | Freq: Two times a day (BID) | ORAL | 0 refills | Status: DC

## 2016-08-04 NOTE — Telephone Encounter (Signed)
-----   Message from [name], PA-C sent at 08/04/2016  6:47 AM EDT ----- Labs look good overall. Her urine shows signs of infection. It is too late to add-on a urine culture. I would like to start her on Keflex 500 mg BID x 7 days. Follow-up in lab in 7-10 days for repeat UA and culture.

## 2016-08-04 NOTE — Telephone Encounter (Signed)
LMOM on son's VM [pt does not speak English] with contact name and number [for return call, if needed] RE: Results, need to start ABX [sent to pharmacy], and repeat Lab needed [Orders placed] and further provider instructions/SLS 09/29

## 2016-09-07 ENCOUNTER — Encounter: Payer: Self-pay | Admitting: Physician Assistant

## 2016-11-28 ENCOUNTER — Encounter: Payer: Self-pay | Admitting: Physician Assistant

## 2016-11-28 ENCOUNTER — Ambulatory Visit (INDEPENDENT_AMBULATORY_CARE_PROVIDER_SITE_OTHER): Payer: BLUE CROSS/BLUE SHIELD | Admitting: Physician Assistant

## 2016-11-28 ENCOUNTER — Encounter: Payer: Self-pay | Admitting: Gastroenterology

## 2016-11-28 VITALS — BP 128/82 | HR 77 | Temp 97.8°F | Resp 14 | Ht 62.0 in | Wt 125.0 lb

## 2016-11-28 DIAGNOSIS — K629 Disease of anus and rectum, unspecified: Secondary | ICD-10-CM

## 2016-11-28 NOTE — Patient Instructions (Signed)
Please stay well hydrated. Start a fiber supplement and stool softener to help passage of regular stools.  Leakage is coming from around a palpable mass. This needs quick assessment and anoscopy (visualization of anus and lower part of rectum) so that an accurate diagnosis can be made and the appropriate treatment can be given.   I am setting you up with Colorectal Surgery.  They will be calling you for an appointment. If you do not hear from them within 2 days, please call me. It is very important that we get this assessment to rule out worrisome things like anal cancer.

## 2016-11-28 NOTE — Progress Notes (Signed)
Pre visit review using our clinic review tool, if applicable. No additional management support is needed unless otherwise documented below in the visit note. 

## 2016-11-28 NOTE — Progress Notes (Signed)
   Patient presents to clinic today with her son. Patient c/o 1 month of stool leakage from her rectum. Notes straining with regular bowel movements. Denies tenesmus, melena. Occasional hematochezia. Patient with history of hemorrhoids, denies issue for quite some time. Endorses good appetite. Denies abdominal pain, nausea or vomiting. Denies fever, chills, weight loss or fatigue.   Past Medical History:  Diagnosis Date  . Chicken pox     Current Outpatient Prescriptions on File Prior to Visit  Medication Sig Dispense Refill  . Omega-3 Fatty Acids (FISH OIL PO) Take by mouth. Take 1 capsule by mouth 2 times daily.     No current facility-administered medications on file prior to visit.     No Known Allergies  Family History  Problem Relation Age of Onset  . Diabetes Mother   . Hypertension Mother     Social History   Social History  . Marital status: Married    Spouse name: N/A  . Number of children: N/A  . Years of education: N/A   Social History Main Topics  . Smoking status: Never Smoker  . Smokeless tobacco: Never Used  . Alcohol use No  . Drug use: No  . Sexual activity: Not Currently   Other Topics Concern  . None   Social History Narrative  . None   Review of Systems - See HPI.  All other ROS are negative.  BP 128/82   Pulse 77   Temp 97.8 F (36.6 C) (Oral)   Resp 14   Ht 5\' 2"  (1.575 m)   Wt 125 lb (56.7 kg)   SpO2 99%   BMI 22.86 kg/m   Physical Exam  Constitutional: She is oriented to person, place, and time and well-developed, well-nourished, and in no distress.  HENT:  Head: Normocephalic and atraumatic.  Eyes: Conjunctivae are normal.  Neck: Neck supple.  Cardiovascular: Normal rate, regular rhythm, normal heart sounds and intact distal pulses.   Pulmonary/Chest: Effort normal.  Abdominal: Soft. Bowel sounds are normal. She exhibits no distension and no mass. There is no tenderness. There is no rebound and no guarding.  Genitourinary:  Rectal exam shows internal hemorrhoid and mass.  Genitourinary Comments: External mass about 2 cm noted of external anal sphincter. No ulceration noted. Is indurated but non-tender. Concern for neoplasm.   Neurological: She is alert and oriented to person, place, and time.  Skin: Skin is warm and dry. No rash noted.  Psychiatric: Affect normal.  Vitals reviewed.  Assessment/Plan: 1. Anal lesion Concern for malignancy. Also with internal mass, potential hemorrhoid but needs further evaluation and direct anoscopy/proctoscopy. Urgent referral placed to Colorectal Surgery for assessment. Discussed supportive measures to help with stool passage. Sitz baths for cleansing. Further management per specialist.  - Ambulatory referral to [facility], Isidore Margraf Cody, PA-C

## 2016-12-05 ENCOUNTER — Ambulatory Visit (INDEPENDENT_AMBULATORY_CARE_PROVIDER_SITE_OTHER): Payer: BLUE CROSS/BLUE SHIELD | Admitting: Gastroenterology

## 2016-12-05 ENCOUNTER — Encounter: Payer: Self-pay | Admitting: Gastroenterology

## 2016-12-05 VITALS — BP 120/80 | HR 66 | Ht 62.0 in | Wt 126.8 lb

## 2016-12-05 DIAGNOSIS — K629 Disease of anus and rectum, unspecified: Secondary | ICD-10-CM | POA: Diagnosis not present

## 2016-12-05 DIAGNOSIS — Z1211 Encounter for screening for malignant neoplasm of colon: Secondary | ICD-10-CM

## 2016-12-05 MED ORDER — NA SULFATE-K SULFATE-MG SULF 17.5-3.13-1.6 GM/177ML PO SOLN: 1.0000 | ORAL | 0 refills | Status: DC

## 2016-12-05 NOTE — Progress Notes (Signed)
Agree with assessment and plan. DRE as outlined shows rectal mass. She needs colonoscopy as soon as possible to rule out malignancy. She was recommended to have a colonoscopy in a few days but declined and not able to schedule this for a few weeks. If she has symptoms of obstruction in the interim she should contact us,

## 2016-12-05 NOTE — Progress Notes (Signed)
     12/05/2016 Louise Wampole [id number] 10/19/1951   HISTORY OF PRESENT ILLNESS:  This is a 66 year old [place] female who is new to our office and was referred here by her PCP, [name], PA-C, for evaluation of an anal lesion.  Patient says that she has what she thinks is a hemorrhoid that has been "coming out" for quite some time.  Denies any rectal pain or bleeding.  No bowel issues.  Says that she moves her bowels well/regularly.  No abdominal pain.  Has never undergone colonoscopy in the past.  Patient primarily speaks [place] so most of the information was obtained via an interpreter.     Past Medical History:  Diagnosis Date  . Chicken pox    Past Surgical History:  Procedure Laterality Date  . TUBAL LIGATION      reports that she has never smoked. She has never used smokeless tobacco. She reports that she does not drink alcohol or use drugs. family history includes Diabetes in her mother; Hypertension in her mother. No Known Allergies    Outpatient Encounter Prescriptions as of 12/05/2016  Medication Sig  . Omega-3 Fatty Acids (FISH OIL PO) Take by mouth. Take 1 capsule by mouth 2 times daily.   No facility-administered encounter medications on file as of 12/05/2016.      REVIEW OF SYSTEMS  : All other systems reviewed and negative except where noted in the History of Present Illness.   PHYSICAL EXAM: BP 120/80   Pulse 66   Ht 5\' 2"  (1.575 m)   Wt 126 lb 12.8 oz (57.5 kg)   SpO2 99%   BMI 23.19 kg/m  General: Well developed Asian female in no acute distress Head: Normocephalic and atraumatic Eyes:  Sclerae anicteric, conjunctiva pink. Ears: Normal auditory acuity Lungs: Clear throughout to auscultation Heart: Regular rate and rhythm Abdomen: Soft, non-distended.  Normal bowel sounds.  Non-tender. Rectal:  Firm mass/lesion noted externally, which was noted to extend into the anal canal on DRE.  Seemed to cause some narrowing in the anal  canal. Musculoskeletal: Symmetrical with no gross deformities  Skin: No lesions on visible extremities Extremities: No edema  Neurological: Alert oriented x 4, grossly non-focal Psychological:  Alert and cooperative. Normal mood and affect  ASSESSMENT AND PLAN: -Anal mass/lesion:  Hard lesion seen externally and extending into the anal canal.  Will schedule colonoscopy but also referring to Dr. Marcello Moores at [place]. -Screening colonoscopy:  Will schedule colonoscopy.  The risks, benefits, and alternatives to colonoscopy were discussed with the patient and she consents to proceed.  *Patient was offered Monday February 5th for colonoscopy but declined and elected to wait until February 65th.    CC:  [name], PA-C

## 2016-12-05 NOTE — Patient Instructions (Signed)

## 2016-12-11 ENCOUNTER — Encounter: Payer: Self-pay | Admitting: Gastroenterology

## 2016-12-19 ENCOUNTER — Telehealth: Payer: Self-pay | Admitting: Physician Assistant

## 2016-12-19 NOTE — Telephone Encounter (Addendum)
Spoke to patients son and will call pharmacy with coupon so prep is considerably cheaper. Patients son expresses understanding.   Coupon code called into pharmacy.

## 2016-12-19 NOTE — Telephone Encounter (Signed)
Patient needs rx for the liquid she needs to drink before having her colonoscopy on 2/19. The pharmacy said it was $100+ and patient could not pay that much. Is this correct? Please call son back at [phone].   Thank you

## 2016-12-19 NOTE — Telephone Encounter (Signed)
Is there another medication patient can take for prep prior to colonoscopy due to cost? Please contact patient's son

## 2016-12-25 ENCOUNTER — Other Ambulatory Visit: Payer: Self-pay

## 2016-12-25 ENCOUNTER — Other Ambulatory Visit (INDEPENDENT_AMBULATORY_CARE_PROVIDER_SITE_OTHER): Payer: BLUE CROSS/BLUE SHIELD

## 2016-12-25 ENCOUNTER — Ambulatory Visit (AMBULATORY_SURGERY_CENTER): Payer: BLUE CROSS/BLUE SHIELD | Admitting: Gastroenterology

## 2016-12-25 ENCOUNTER — Telehealth: Payer: Self-pay

## 2016-12-25 VITALS — BP 125/75 | HR 72 | Temp 97.5°F | Resp 14 | Ht 62.0 in | Wt 125.0 lb

## 2016-12-25 DIAGNOSIS — K6289 Other specified diseases of anus and rectum: Secondary | ICD-10-CM

## 2016-12-25 DIAGNOSIS — C20 Malignant neoplasm of rectum: Secondary | ICD-10-CM

## 2016-12-25 DIAGNOSIS — K629 Disease of anus and rectum, unspecified: Secondary | ICD-10-CM | POA: Diagnosis not present

## 2016-12-25 LAB — BUN: BUN: 21 mg/dL (ref 6–23)

## 2016-12-25 LAB — CREATININE, SERUM: CREATININE: 0.93 mg/dL (ref 0.40–1.20)

## 2016-12-25 MED ORDER — SODIUM CHLORIDE 0.9 % IV SOLN: 500.0000 mL | INTRAVENOUS | Status: DC

## 2016-12-25 MED ORDER — POLYETHYLENE GLYCOL 3350 17 GM/SCOOP PO POWD
34.0000 g | Freq: Two times a day (BID) | ORAL | 5 refills | Status: DC
Start: 2016-12-25 — End: 2017-03-01

## 2016-12-25 NOTE — Progress Notes (Signed)
Called to room to assist during endoscopic procedure.  Patient ID and intended procedure confirmed with present staff. Received instructions for my participation in the procedure from the performing physician.  

## 2016-12-25 NOTE — Patient Instructions (Signed)
YOU HAD AN ENDOSCOPIC PROCEDURE TODAY AT [facility]:   Refer to the procedure report that was given to you for any specific questions about what was found during the examination.  If the procedure report does not answer your questions, please call your gastroenterologist to clarify.  If you requested that your care partner not be given the details of your procedure findings, then the procedure report has been included in a sealed envelope for you to review at your convenience later.  YOU SHOULD EXPECT: Some feelings of bloating in the abdomen. Passage of more gas than usual.  Walking can help get rid of the air that was put into your GI tract during the procedure and reduce the bloating. If you had a lower endoscopy (such as a colonoscopy or flexible sigmoidoscopy) you may notice spotting of blood in your stool or on the toilet paper. If you underwent a bowel prep for your procedure, you may not have a normal bowel movement for a few days.  Please Note:  You might notice some irritation and congestion in your nose or some drainage.  This is from the oxygen used during your procedure.  There is no need for concern and it should clear up in a day or so.  SYMPTOMS TO REPORT IMMEDIATELY:   Following lower endoscopy (colonoscopy or flexible sigmoidoscopy):  Excessive amounts of blood in the stool  Significant tenderness or worsening of abdominal pains  Swelling of the abdomen that is new, acute  Fever of 100F or higher   Following upper endoscopy (EGD)  Vomiting of blood or coffee ground material  New chest pain or pain under the shoulder blades  Painful or persistently difficult swallowing  New shortness of breath  Fever of 100F or higher  Black, tarry-looking stools  For urgent or emergent issues, a gastroenterologist can be reached at any hour by calling [phone].   DIET:  We do recommend a small meal at first, but then you may proceed to your regular diet.  Drink  plenty of fluids but you should avoid alcoholic beverages for 24 hours.  ACTIVITY:  You should plan to take it easy for the rest of today and you should NOT DRIVE or use heavy machinery until tomorrow (because of the sedation medicines used during the test).    FOLLOW UP: Our staff will call the number listed on your records the next business day following your procedure to check on you and address any questions or concerns that you may have regarding the information given to you following your procedure. If we do not reach you, we will leave a message.  However, if you are feeling well and you are not experiencing any problems, there is no need to return our call.  We will assume that you have returned to your regular daily activities without incident.  If any biopsies were taken you will be contacted by phone or by letter within the next 1-3 weeks.  Please call us at [phone] if you have not heard about the biopsies in 3 weeks.    SIGNATURES/CONFIDENTIALITY: You and/or your care partner have signed paperwork which will be entered into your electronic medical record.  These signatures attest to the fact that that the information above on your After Visit Summary has been reviewed and is understood.  Full responsibility of the confidentiality of this discharge information lies with you and/or your care-partner.    CT of chest/ abdomen/ pelvis with contrast.  Dr.  Armbruster's nurse will call you with this appointment. MIRALAX 2 cap full once to twice daily to keep stools soft and prevent rx was sent to the pharmacy. You may resume your other current medications today. Await biopsy results. Please call if any questions or concerns.

## 2016-12-25 NOTE — Telephone Encounter (Signed)
Spoke to son, patient is scheduled for CT chest/abdomen/pelvis w/ contrast for 2/22 at [facility] to arrive at 9:15 for 9:30 scan. Instructions given to drink contrast one bottle at 7:30, other at 8:30. Otherwise, NPO 4 hours prior. Patient had already picked up 2 bottles of contrast, BUN/Creatinine ordered. Faxed over referral to [facility].

## 2016-12-25 NOTE — Progress Notes (Signed)
Interpreter used today at the [facility] for this pt.  Interpreter's name is-Pt's states no medical or surgical changes since previsit or office visit.

## 2016-12-25 NOTE — Telephone Encounter (Signed)
-----   Message from Manus Gunning, MD sent at 12/25/2016 12:14 PM EST ----- Anna Lawrence this patient has a large rectal mass noted on her procedure today. She needs CT chest / abdomen / pelvis with contrast scheduled, as well as an appointment with either Dr. Marcello Moores or Dr. Johney Maine if not already scheduled ASAP (she is near obstructing). She speak [place].   Thanks much for your help

## 2016-12-25 NOTE — Op Note (Signed)
[facility] Patient Name: Anna Lawrence Procedure Date: 12/25/2016 11:30 AM MRN: [id number] Endoscopist: Remo Lipps P. Aryiah Monterosso MD, MD Age: 66 Referring MD:  Date of Birth: 1951/10/03 Gender: Female Account #: [id number] Procedure:                Colonoscopy Indications:              Rectal mass on DRE Medicines:                Monitored Anesthesia Care Procedure:                Pre-Anesthesia Assessment:                           - Prior to the procedure, a History and Physical                            was performed, and patient medications and                            allergies were reviewed. The patient's tolerance of                            previous anesthesia was also reviewed. The risks                            and benefits of the procedure and the sedation                            options and risks were discussed with the patient.                            All questions were answered, and informed consent                            was obtained. Prior Anticoagulants: The patient has                            taken no previous anticoagulant or antiplatelet                            agents. ASA Grade Assessment: II - A patient with                            mild systemic disease. After reviewing the risks                            and benefits, the patient was deemed in                            satisfactory condition to undergo the procedure.                           After obtaining informed consent, the colonoscope  was passed under direct vision. Throughout the                            procedure, the patient's blood pressure, pulse, and                            oxygen saturations were monitored continuously. The                            Model PCF-[id number]) scope was introduced                            through the anus with the intention of advancing to                            the cecum. The scope was  advanced to the sigmoid                            colon before the procedure was aborted. Medications                            were not given. The colonoscopy was performed                            without difficulty. The patient tolerated the                            procedure well. The quality of the bowel                            preparation was adequate. The rectum was                            photographed. Scope In: 11:50:04 AM Scope Out: 12:00:36 PM Total Procedure Duration: 0 hours 10 minutes 32 seconds  Findings:                 The perianal exam findings include rectal mass                            extending into perianal area with nodularity. The                            pediatric colonoscope could not traverse the mass                            due to narrowed lumen, thus an upper endoscope was                            used which was able to traverse it.                           A fungating partially obstructing large mass was  found at the anus and in the distal rectum,                            extending from the anal canal up a few cm. The mass                            was circumferential with a severe stenosis.                            Biopsies were taken with a cold forceps for                            histology. The area just proximal to the mass was                            tattooed with an injection of Spot (carbon black).                           The exam was otherwise without abnormality - the                            most proximal extent of the colon reached with the                            upper endoscope was what I suspect was the proximal                            transverse colon. No polyps seen. Complications:            No immediate complications. Estimated blood loss:                            Minimal. Estimated Blood Loss:     Estimated blood loss was minimal. Impression:               - Rectal  mass extending into perianal area. found                            on perianal exam.                           - Likely malignant partially obstructing tumor at                            the anus and in the rectum. Biopsied. Tattooed.                           - The examination was otherwise normal up to the                            transverse colon (extent of reach of upper  endoscope) Recommendation:           - Patient has a contact number available for                            emergencies. The signs and symptoms of potential                            delayed complications were discussed with the                            patient. Return to normal activities tomorrow.                            Written discharge instructions were provided to the                            patient.                           - Resume previous diet.                           - Continue present medications.                           - Start miralax double dose once to twice daily to                            keep stools soft and prevent obstruction                           - General surgery consult ASAP if not already                            scheduled                           - CT of the chest / abdomen / pelvis with contrast                           - Await pathology results with further                            recommendations. Remo Lipps P. Yarethzy Croak MD, MD 12/25/2016 12:11:59 PM This report has been signed electronically.

## 2016-12-26 ENCOUNTER — Other Ambulatory Visit: Payer: Self-pay

## 2016-12-26 ENCOUNTER — Telehealth: Payer: Self-pay

## 2016-12-26 NOTE — Telephone Encounter (Signed)
  Follow up Call-  Call back number 12/25/2016  Post procedure Call Back phone  # [phone] cell number patient does not speak english.  Permission to leave phone message Yes  Some recent data might be hidden     Patient questions:  Do you have a fever, pain , or abdominal swelling? No. Pain Score  0 *  Have you tolerated food without any problems? Yes.    Have you been able to return to your normal activities? Yes.    Do you have any questions about your discharge instructions: Diet   No. Medications  No. Follow up visit  No.  Do you have questions or concerns about your Care? No.  Actions: * If pain score is 4 or above: No action needed, pain <4.

## 2016-12-27 ENCOUNTER — Telehealth: Payer: Self-pay

## 2016-12-27 ENCOUNTER — Other Ambulatory Visit: Payer: Self-pay

## 2016-12-27 DIAGNOSIS — C210 Malignant neoplasm of anus, unspecified: Secondary | ICD-10-CM

## 2016-12-27 NOTE — Telephone Encounter (Signed)
I called the patient's son, the patient is from [place] and does not speak English, and discussed pathology results. He verbalized understanding. 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 I suspect she will need chemoradiation therapy for treatment, but will await staging CT which is scheduled for tomorrow.  Almyra Free, can you please refer this patient to Radiation Oncology and Medical Oncology and contact the son to coordinate appointments as soon as possible. She has been referred to general surgery as well, although suspect radiation therapy may be preferred for this type of malignancy.   Thanks   Son's phone is [phone]

## 2016-12-27 NOTE — Telephone Encounter (Signed)
Spoke to Dr. 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

## 2016-12-28 ENCOUNTER — Telehealth: Payer: Self-pay | Admitting: Oncology

## 2016-12-28 ENCOUNTER — Encounter (HOSPITAL_COMMUNITY): Payer: Self-pay

## 2016-12-28 ENCOUNTER — Encounter: Payer: Self-pay | Admitting: Oncology

## 2016-12-28 ENCOUNTER — Ambulatory Visit (HOSPITAL_COMMUNITY)
Admission: RE | Admit: 2016-12-28 | Discharge: 2016-12-28 | Disposition: A | Discharge status: Home / Self Care | Payer: BLUE CROSS/BLUE SHIELD | Source: Ambulatory Visit | Attending: Gastroenterology | Admitting: Gastroenterology

## 2016-12-28 DIAGNOSIS — K769 Liver disease, unspecified: Secondary | ICD-10-CM | POA: Insufficient documentation

## 2016-12-28 DIAGNOSIS — K6289 Other specified diseases of anus and rectum: Secondary | ICD-10-CM

## 2016-12-28 DIAGNOSIS — R590 Localized enlarged lymph nodes: Secondary | ICD-10-CM | POA: Diagnosis not present

## 2016-12-28 DIAGNOSIS — K629 Disease of anus and rectum, unspecified: Secondary | ICD-10-CM | POA: Diagnosis not present

## 2016-12-28 MED ORDER — IOPAMIDOL (ISOVUE-300) INJECTION 61%
INTRAVENOUS | Status: AC
  Filled 2016-12-28: qty 100

## 2016-12-28 MED ORDER — IOPAMIDOL (ISOVUE-300) INJECTION 61%
100.0000 mL | Freq: Once | INTRAVENOUS | Status: AC | PRN
  Administered 2016-12-28: 100 mL via INTRAVENOUS

## 2016-12-28 MED ORDER — SODIUM CHLORIDE 0.9 % IJ SOLN
INTRAMUSCULAR | Status: AC
  Filled 2016-12-28: qty 50

## 2016-12-28 NOTE — Telephone Encounter (Signed)
Tc to the pt's son to schedule an appt. Appt has been scheduled for the pt to see Dr. Benay Spice on 3/2 at [facility] to have the pt arrive at 930am in order to get registered and checked in on time. Demographics info verified. Letter mailed.

## 2016-12-29 ENCOUNTER — Encounter: Payer: Self-pay | Admitting: Radiation Oncology

## 2017-01-05 ENCOUNTER — Encounter: Payer: Self-pay | Admitting: Radiation Oncology

## 2017-01-05 ENCOUNTER — Ambulatory Visit: Admission: RE | Admit: 2017-01-05 | Payer: BLUE CROSS/BLUE SHIELD | Source: Ambulatory Visit

## 2017-01-05 ENCOUNTER — Telehealth: Payer: Self-pay | Admitting: Oncology

## 2017-01-05 ENCOUNTER — Ambulatory Visit (HOSPITAL_BASED_OUTPATIENT_CLINIC_OR_DEPARTMENT_OTHER): Payer: BLUE CROSS/BLUE SHIELD | Admitting: Oncology

## 2017-01-05 ENCOUNTER — Ambulatory Visit
Admission: RE | Admit: 2017-01-05 | Discharge: 2017-01-05 | Disposition: A | Discharge status: Home / Self Care | Payer: BLUE CROSS/BLUE SHIELD | Source: Ambulatory Visit | Attending: Radiation Oncology | Admitting: Radiation Oncology

## 2017-01-05 VITALS — BP 127/74 | HR 82 | Temp 98.0°F | Resp 16 | Ht 62.0 in | Wt 123.5 lb

## 2017-01-05 DIAGNOSIS — C210 Malignant neoplasm of anus, unspecified: Secondary | ICD-10-CM

## 2017-01-05 DIAGNOSIS — C774 Secondary and unspecified malignant neoplasm of inguinal and lower limb lymph nodes: Secondary | ICD-10-CM

## 2017-01-05 DIAGNOSIS — R112 Nausea with vomiting, unspecified: Secondary | ICD-10-CM | POA: Insufficient documentation

## 2017-01-05 DIAGNOSIS — R300 Dysuria: Secondary | ICD-10-CM | POA: Insufficient documentation

## 2017-01-05 DIAGNOSIS — Z1211 Encounter for screening for malignant neoplasm of colon: Secondary | ICD-10-CM | POA: Insufficient documentation

## 2017-01-05 NOTE — Progress Notes (Signed)
GI Location of Tumor / Histology: Rectal and anus  .  Anna Lawrence presented  months ago with symptoms of: bleeding, loose stools,diarrhea 5-6 months  Biopsies of (if applicable) revealed:Diagnosis :12/25/16 Rectum, biopsy, and anus - INVASIVE SQUAMOUS CELL CARCINOMA.   Past/Anticipated interventions by surgeon, if any: Dr. Johney Maine seen this past Friday, surgery not recommended  Past/Anticipated interventions by medical oncology, if any: Dr. Benay Spice seen today   Weight changes, if any: stable  Bowel/Bladder complaints, if any, scant amount blood with bowels, bladder normal  Nausea / Vomiting, if any:   Pain issues, if any:  Any blood per rectum:  }  SAFETY ISSUES:  Prior radiation? No  Pacemaker/ICD? NO  Possible current pregnancy? NO  Is the patient on methotrexate? No  Current Complaints/Details: Married, 2 children, no hx family cancer

## 2017-01-05 NOTE — Progress Notes (Signed)
GI Location of Tumor / Histology: Rectal and anus  .  Anna Lawrence presented  months ago with symptoms of:   Biopsies of (if applicable) revealed:Diagnosis :12/25/16 Rectum, biopsy, and anus - INVASIVE SQUAMOUS CELL CARCINOMA.   Past/Anticipated interventions by surgeon, if any:Dr. Gross seen 12/29/16  Surgery not recommended  Past/Anticipated interventions by medical oncology, if any: Dr. Benay Spice seen today   Weight changes, if any: stable  Bowel/Bladder complaints, if any  Nausea / Vomiting, if any:   Pain issues, if any:  Any blood per rectum:    SAFETY ISSUES:  Prior radiation?  Pacemaker/ICD?   Possible current pregnancy? NO  Is the patient on methotrexate?   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

## 2017-01-05 NOTE — Progress Notes (Signed)
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 AJCC N Category: Staged < 8th Ed. AJCC M Category: Staged < 8th Ed. AJCC 8 Stage Grouping: Staged < 8th Ed. Current Disease Status: Newly Diagnosed - Locoregional Disease Not Amendable to Local Excision  Intent of Therapy: Curative Intent, Discussed with Patient

## 2017-01-05 NOTE — Progress Notes (Signed)
Anna Lawrence Patient Consult   Referring MD: Janashia Honey Goens 66 y.o.  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    She reports a history of "hemorrhoids "for the past 6 months. She has intermittent discomfort and rectal bleeding.She was referred to Dr. Havery Moros and was taken to a colonoscopy 12/25/2016. A rectal mass extending into the perianal area was noted on digital exam. The mass was traversed with an upper endoscope. A fungating partially obstructing mass was found at the anus and distal rectum. The mass was circumferential with severe stenosis. Biopsies were obtained. The scope reach the proximal transverse colon and no polyps were seen. The pathology [id number]) revealed invasive squamous cell carcinoma.  She was referred for CTs of the chest, abdomen, and pelvis on 12/28/2016. No suspicious pulmonary nodules. No mediastinal hilar adenopathy. Profound masslike thickening was noted at the distal rectum immediately above the anal verge. A 6 mm right-sided mesorectal node. Multiple enlarged enhancing left inguinal nodes measuring up to 3.2 cm. 1.57 m (external iliac node. 1.1 m left internal iliac node. No other evidence of metastatic disease in the abdomen or pelvis. A 6 mm lesion in segment 6 of the liver is felt to be benign.     Past Medical History:  Diagnosis Date  . Chicken pox      .  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 TUBAL LIGATION      Medications: Reviewed  Allergies: No Known Allergies  Family history: No family history of cancer  Social History:   She lives with her husband and son in [place]. She works as a Biomedical scientist. No transfusion history. She does not use tobacco or alcohol. No risk factor for HIV or hepatitis.   ROS:   Positives include:Rectal discomfort and bleeding  A complete ROS was  otherwise negative.  Physical Exam:  Blood pressure 127/74, pulse 82, temperature 98 F (36.7 C), temperature source Oral, resp. rate 16, height 5\' 2"  (1.575 m), weight 123 lb 8 oz (56 kg), SpO2 100 %.  HEENT: Oropharynx without visible mass, neck without mass  Lungs: Clear bilaterally  Cardiac: Regular rate and rhythm  Abdomen: No hepatosplenomegaly, no mass, nontender  Rectal: Masslike nodularity at the left anal verge extending into the anal canal/rectum. I cannot pass the mass with the examination finger.  Vascular: No leg edema  Lymph nodes: No cervical, supraclavicular, axillary, or right inguinal nodes. Approximate 3 cm firm left inguinal node.  Neurologic: Alert and oriented, the motor exam appears intact in the upper and lower extremities  Skin: No rash  Musculoskeletal: No spine tenderness  LAB:  CBC  Lab Results  Component Value Date   WBC 3.8 (L) 08/02/2016   HGB 12.0 08/02/2016   HCT 35.5 (L) 08/02/2016   MCV 88.5 08/02/2016   PLT 215.0 08/02/2016     CMP      Component Value Date/Time   NA 141 08/02/2016 1147   K 4.5 08/02/2016 1147   CL 106 08/02/2016 1147   CO2 27 08/02/2016 1147   GLUCOSE 80 08/02/2016 1147   BUN 21 12/25/2016 1302   CREATININE 0.93 12/25/2016 1302   CALCIUM 9.4 08/02/2016 1147   PROT 7.9 08/02/2016 1147   ALBUMIN 4.3 08/02/2016 1147   AST 19 08/02/2016 1147   ALT 14 08/02/2016 1147   ALKPHOS 49 08/02/2016 1147  BILITOT 0.4 08/02/2016 1147     Imaging:  As per history of present illness, CT images from 12/28/2016-reviewed   Assessment/Plan:   1. Squamous cell carcinoma the anus  staging CTs 12/28/2016 confirmed an anorectal mass, inguinal/iliac adenopathy   Disposition:   Anna Lawrence has been diagnosed with squamous cell carcinoma the anus. Her case was presented at the GI tumor conference on 01/03/2017. The consensus recommendation is to proceed with concurrent chemotherapy and radiation.  I discussed the  diagnosis and treatment recommendations with the patient and her son via an interpreter. She will see Dr. Lisbeth Renshaw later today. I discussed the case with Dr. Lisbeth Renshaw. She will be referred for a staging PET scan.  I recommend 5-FU/mitomycin-C chemotherapy to begin concurrent with radiation. We discussed the potential toxicities associated with this regimen including the chance for nausea/vomiting, mucositis, diarrhea, alopecia, and hematologic toxicity. We discussed the hand/foot syndrome and hyperpigmentation associated with 5-fluorouracil. 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 We discussed the potential for skin toxicity at the labia and perineum with combined modality therapy.  She agrees to proceed.  She will be referred for placement of a PICC for the administration of chemotherapy. The plan is to initiate concurrent chemotherapy and radiation on 01/15/2017.  50 minutes were spent with the patient today. The majority of the time was used for counseling and coordination of care.  A chemotherapy plan was entered.    Betsy Coder, MD  01/05/2017, 10:34 AM

## 2017-01-05 NOTE — Telephone Encounter (Signed)
Appointments scheduled per 3/2 LOS. Patient given AVS report and calendars with future scheduled appointments. Patient aware of PET scan appointment to be scheduled.

## 2017-01-10 ENCOUNTER — Telehealth: Payer: Self-pay | Admitting: *Deleted

## 2017-01-10 ENCOUNTER — Encounter: Payer: Self-pay | Admitting: *Deleted

## 2017-01-10 ENCOUNTER — Encounter: Payer: Self-pay | Admitting: Radiation Oncology

## 2017-01-10 ENCOUNTER — Ambulatory Visit
Admission: RE | Admit: 2017-01-10 | Discharge: 2017-01-10 | Disposition: A | Discharge status: Home / Self Care | Payer: BLUE CROSS/BLUE SHIELD | Source: Ambulatory Visit | Attending: Radiation Oncology | Admitting: Radiation Oncology

## 2017-01-10 ENCOUNTER — Other Ambulatory Visit (HOSPITAL_BASED_OUTPATIENT_CLINIC_OR_DEPARTMENT_OTHER): Payer: BLUE CROSS/BLUE SHIELD

## 2017-01-10 ENCOUNTER — Other Ambulatory Visit: Payer: Self-pay | Admitting: Radiation Oncology

## 2017-01-10 ENCOUNTER — Encounter: Payer: Self-pay | Admitting: Oncology

## 2017-01-10 ENCOUNTER — Other Ambulatory Visit (HOSPITAL_COMMUNITY)
Admission: RE | Admit: 2017-01-10 | Discharge: 2017-01-10 | Disposition: A | Discharge status: Home / Self Care | Payer: BLUE CROSS/BLUE SHIELD | Source: Ambulatory Visit | Attending: Radiation Oncology | Admitting: Radiation Oncology

## 2017-01-10 ENCOUNTER — Other Ambulatory Visit: Payer: BLUE CROSS/BLUE SHIELD

## 2017-01-10 VITALS — BP 146/72 | HR 68 | Temp 97.9°F | Resp 16 | Ht 62.0 in | Wt 124.0 lb

## 2017-01-10 DIAGNOSIS — Z1211 Encounter for screening for malignant neoplasm of colon: Secondary | ICD-10-CM

## 2017-01-10 DIAGNOSIS — C210 Malignant neoplasm of anus, unspecified: Secondary | ICD-10-CM

## 2017-01-10 DIAGNOSIS — C774 Secondary and unspecified malignant neoplasm of inguinal and lower limb lymph nodes: Secondary | ICD-10-CM | POA: Diagnosis not present

## 2017-01-10 DIAGNOSIS — Z01419 Encounter for gynecological examination (general) (routine) without abnormal findings: Secondary | ICD-10-CM | POA: Insufficient documentation

## 2017-01-10 DIAGNOSIS — R112 Nausea with vomiting, unspecified: Secondary | ICD-10-CM | POA: Diagnosis present

## 2017-01-10 DIAGNOSIS — Z1151 Encounter for screening for human papillomavirus (HPV): Secondary | ICD-10-CM | POA: Diagnosis not present

## 2017-01-10 DIAGNOSIS — R300 Dysuria: Secondary | ICD-10-CM | POA: Diagnosis not present

## 2017-01-10 LAB — COMPREHENSIVE METABOLIC PANEL
ALT: 10 U/L (ref 0–55)
AST: 16 U/L (ref 5–34)
Albumin: 4.1 g/dL (ref 3.5–5.0)
Alkaline Phosphatase: 61 U/L (ref 40–150)
Anion Gap: 7 mEq/L (ref 3–11)
BUN: 18.7 mg/dL (ref 7.0–26.0)
CHLORIDE: 108 meq/L (ref 98–109)
CO2: 26 meq/L (ref 22–29)
CREATININE: 0.9 mg/dL (ref 0.6–1.1)
Calcium: 10 mg/dL (ref 8.4–10.4)
EGFR: 65 mL/min/{1.73_m2} — ABNORMAL LOW (ref >90)
GLUCOSE: 93 mg/dL (ref 70–140)
Potassium: 4.4 mEq/L (ref 3.5–5.1)
SODIUM: 141 meq/L (ref 136–145)
Total Bilirubin: 0.3 mg/dL (ref 0.20–1.20)
Total Protein: 7.9 g/dL (ref 6.4–8.3)

## 2017-01-10 LAB — CBC WITH DIFFERENTIAL/PLATELET
BASO%: 0.7 % (ref 0.0–2.0)
Basophils Absolute: 0 10*3/uL (ref 0.0–0.1)
EOS%: 1.1 % (ref 0.0–7.0)
Eosinophils Absolute: 0 10*3/uL (ref 0.0–0.5)
HEMATOCRIT: 29.9 % — AB (ref 34.8–46.6)
HGB: 9.5 g/dL — ABNORMAL LOW (ref 11.6–15.9)
LYMPH#: 1 10*3/uL (ref 0.9–3.3)
LYMPH%: 25.6 % (ref 14.0–49.7)
MCH: 26.9 pg (ref 25.1–34.0)
MCHC: 31.8 g/dL (ref 31.5–36.0)
MCV: 84.6 fL (ref 79.5–101.0)
MONO#: 0.3 10*3/uL (ref 0.1–0.9)
MONO%: 7.7 % (ref 0.0–14.0)
NEUT#: 2.7 10*3/uL (ref 1.5–6.5)
NEUT%: 64.9 % (ref 38.4–76.8)
Platelets: 333 10*3/uL (ref 145–400)
RBC: 3.53 10*6/uL — AB (ref 3.70–5.45)
RDW: 13.5 % (ref 11.2–14.5)
WBC: 4.1 10*3/uL (ref 3.9–10.3)

## 2017-01-10 NOTE — Progress Notes (Signed)
Reviewed pt's treatment plan, unfortunately there aren't any foundations offering copay assistance for her Dx & drug at this time.  Since pt will be getting radiation I reached out to [facility] to see if she would contact the pt for the [place].

## 2017-01-10 NOTE — Telephone Encounter (Signed)
Return call received from patient's son.  Patient's son notified that we will delay PICC placement and chemo infusion until 01/22/17 when XRT begins.  Patient's son appreciative of information and will check mychart for times for appts.

## 2017-01-10 NOTE — Telephone Encounter (Signed)
Message left for patient's son to call [facility] back with schedule information.

## 2017-01-10 NOTE — Telephone Encounter (Signed)
Called [phone] left vm on son and pt phone and then called dtr phone [phone] left vm, patient had 8am appt not here interpreter is here [place], asked that they call us backk to reschedule if not coming 8:17 AM

## 2017-01-10 NOTE — Progress Notes (Signed)
GI Location of Tumor / Histology: Rectal and anus  .  Anna Lawrence presented  months ago with symptoms of: rectal bleeding  Biopsies of (if applicable) revealed:Diagnosis :12/25/16 Rectum, biopsy, and anus - INVASIVE SQUAMOUS CELL CARCINOMA.   Past/Anticipated interventions by surgeon, if any:Dr. Gross seen 12/29/16  Surgery not recommended  Past/Anticipated interventions by medical oncology, if any: Dr. Benay Spice seen today   Weight changes, if any: stable  Bowel/Bladder complaints, if any, regular bowels, takes miralax daily, normal bladder  Nausea / Vomiting, if any: no  Pain issues, if OTR:RNHA rectal pain, doesn't take mediation  Any blood per rectum:  no  SAFETY ISSUES:  Prior radiation? NO  Pacemaker/ICD? NO  Possible current pregnancy? NO  Is the patient on methotrexate?   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

## 2017-01-10 NOTE — Progress Notes (Signed)
Radiation Oncology         [phone] ________________________________  Name: Anna Lawrence MRN: [id number]  Date: 01/10/2017  DOB: [birth year]  PO:[id number], Sandria Manly, PA-C  Armbruster, [name]*     REFERRING PHYSICIAN: Havery Moros [name]*   DIAGNOSIS: The encounter diagnosis was Anal cancer [facility]).   HISTORY OF PRESENT ILLNESS: Anna Lawrence is a 66 y.o. female seen at the request of Dr. Benay Spice for a new diagnosis of anal cancer. She had been experiencing intermittent rectal bleeding and discomfort in the anus and underwent colonoscopy on 12/25/16 revealing a rectal mass extending into the perineum, and was partially obstructing, though the scope could reach the transverse colon. 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 She has undergone metastatic work up and a CT chest/abd/pelvis was performed on 12/28/16 and this revealed a 4.8 x 4.2 cm mass like thickening of the distal rectum above the anal verge, a 6 mm right mesorectal lymph node. She also had multiple enlarged left inguinal adenopathy measuring up to 3.2 cm. There was a 1.5 cm external illiac, and 1.1 cm left internal iliac node. No other disease was noted. PET scan is pending at this time, and she has discussed definitive chemoradiotherapy. Of note she has never had an abnormal pap smear and recalls that this was last performed over 5 years ago. She has not had a mammogram in the last 2 years.    PREVIOUS RADIATION THERAPY: No   PAST MEDICAL HISTORY:  Past Medical History:  Diagnosis Date  . Chicken pox   . Colon cancer ([place]) 12/25/2016   rectal/anus       PAST SURGICAL HISTORY: Past Surgical History:  Procedure Laterality Date  . TUBAL LIGATION       FAMILY HISTORY:  Family History  Problem Relation Age of Onset  . Diabetes Mother   . Hypertension Mother      SOCIAL HISTORY:  reports that she has never smoked. She has never used smokeless tobacco. She reports that she  does not drink alcohol or use drugs. The patient is married and resides in [place]. She is accompanied by her adult son. She is a Biomedical scientist at [employer].    ALLERGIES: Patient has no known allergies.   MEDICATIONS:  Current Outpatient Prescriptions  Medication Sig Dispense Refill  . Omega-3 Fatty Acids (FISH OIL PO) Take by mouth. Take 1 capsule by mouth 2 times daily.    . polyethylene glycol powder (GLYCOLAX/MIRALAX) powder Take 34 g by mouth 2 (two) times daily. 850 g 5   Current Facility-Administered Medications  Medication Dose Route Frequency Provider Last Rate Last Dose  . 0.9 %  sodium chloride infusion  500 mL Intravenous Continuous Manus Gunning, MD         REVIEW OF SYSTEMS: On review of systems, the patient reports that she is doing ok overall. She reports poor appetite. She has lost about 40 pounds in the last 6 months, and reports occasional bleeding from her rectum that occurs about once every 2 weeks or so. She denies any chest pain, shortness of breath, cough, fevers, chills, night sweats. She denies any vaginal bleeding or discharge, or bladder disturbances, and denies abdominal pain, nausea or vomiting.She denies any lower extremity edema. She reports the caliber of her stools are thin, and she has multiple bowel movements daily with the use of Maalox. She denies any new musculoskeletal or joint aches or pains. A complete review of systems is obtained and is otherwise  negative.     PHYSICAL EXAM:  Wt Readings from Last 3 Encounters:  01/10/17 124 lb (56.2 kg)  01/05/17 123 lb 8 oz (56 kg)  12/25/16 125 lb (56.7 kg)   Temp Readings from Last 3 Encounters:  01/10/17 97.9 F (36.6 C) (Oral)  01/05/17 98 F (36.7 C) (Oral)  12/25/16 97.5 F (36.4 C)   BP Readings from Last 3 Encounters:  01/10/17 (!) 146/72  01/05/17 127/74  12/25/16 125/75   Pulse Readings from Last 3 Encounters:  01/10/17 68  01/05/17 82  12/25/16 72   Pain Assessment Pain  Score: 0-No pain/10  In general this is a well appearing Asian Female in no acute distress. She is alert and oriented x4 and appropriate throughout the examination. HEENT reveals that the patient is normocephalic, atraumatic. EOMs are intact. PERRLA. Skin is intact without any evidence of gross lesions. Cardiovascular exam reveals a regular rate and rhythm, no clicks rubs or murmurs are auscultated. Chest is clear to auscultation bilaterally. Lymphatic assessment is performed and does not reveal any adenopathy in the cervical, supraclavicular, or axillary chains. Bilateral groins are assessed and fixed, matted adenopathy is noted in the left medial groin, approximately 3 cm in greatest palpable dimension. Abdomen has active bowel sounds in all quadrants and is intact. The abdomen is soft, non tender, non distended. Lower extremities are negative for pretibial pitting edema, deep calf tenderness, cyanosis or clubbing. Pelvic exam is performed and reveals normal appearing external female genitalia. No lesions are seen of the perineal, perianal, or perianal tissue. BUS is normal in appearance. Speculum exam reveals a cervix with hypervascular change over what appears to be a nabothian cyst at 6 o'clock. A pap smear is obtained without difficulty. Bimanual exam reveals a normal feeling cervix with the exception of a cm area of thickness at 6 o'clock, corresponding to the visible abnormality. No induration or nodularity is noted at the posterior vaginal fornices. Her anal cancer is palpable along the posterior vagina and extends to the proximal vagina without interruption of the vaginal wall. Rectal examination reveals thickening and fullness of the vault with tumor. No bleeding is noted on the examination glove.    ECOG = 1  0 - Asymptomatic (Fully active, able to carry on all predisease activities without restriction)  1 - Symptomatic but completely ambulatory (Restricted in physically strenuous activity but  ambulatory and able to carry out work of a light or sedentary nature. For example, light housework, office work)  2 - Symptomatic, <50% in bed during the day (Ambulatory and capable of all self care but unable to carry out any work activities. Up and about more than 50% of waking hours)  3 - Symptomatic, >50% in bed, but not bedbound (Capable of only limited self-care, confined to bed or chair 50% or more of waking hours)  4 - Bedbound (Completely disabled. Cannot carry on any self-care. Totally confined to bed or chair)  5 - Death   Eustace Pen MM, Creech RH, Tormey DC, et al. [phone]). "Toxicity and response criteria of the [facility]". Cable Oncol. 5 (6): 649-55    LABORATORY DATA:  Lab Results  Component Value Date   WBC 4.1 01/10/2017   HGB 9.5 (L) 01/10/2017   HCT 29.9 (L) 01/10/2017   MCV 84.6 01/10/2017   PLT 333 01/10/2017   Lab Results  Component Value Date   NA 141 01/10/2017   K 4.4 01/10/2017   CL 106 08/02/2016  CO2 26 01/10/2017   Lab Results  Component Value Date   ALT 10 01/10/2017   AST 16 01/10/2017   ALKPHOS 61 01/10/2017   BILITOT 0.30 01/10/2017      RADIOGRAPHY: Ct Chest W Contrast  Result Date: 12/28/2016 CLINICAL DATA:  66 year old female with history of recently discovered rectal cancer on colonoscopy 12/24/2016. Rectal bleeding. EXAM: CT CHEST, ABDOMEN, AND PELVIS WITH CONTRAST TECHNIQUE: Multidetector CT imaging of the chest, abdomen and pelvis was performed following the standard protocol during bolus administration of intravenous contrast. CONTRAST:  143mL ISOVUE-300 IOPAMIDOL (ISOVUE-300) INJECTION 61% COMPARISON:  None. FINDINGS: CT CHEST FINDINGS Cardiovascular: Heart size is normal. There is no significant pericardial fluid, thickening or pericardial calcification. No atherosclerotic calcifications identified in the thoracic aorta or the coronary arteries. Mediastinum/Nodes: No pathologically enlarged mediastinal or  hilar lymph nodes. Esophagus is unremarkable in appearance. No axillary lymphadenopathy. Lungs/Pleura: No suspicious appearing pulmonary nodules or masses. No acute consolidative airspace disease. No pleural effusions. Musculoskeletal: There are no aggressive appearing lytic or blastic lesions noted in the visualized portions of the skeleton. CT ABDOMEN PELVIS FINDINGS Hepatobiliary: Well-circumscribed 1.5 cm low-attenuation lesion in segment 8 of the liver is compatible with a simple cyst. 6 mm hypervascular lesion in segment 6 of the liver (image 56 of series 2) is incompletely characterize, but favored to represent a small flash fill cavernous hemangioma or perfusion anomaly. No other suspicious hepatic lesions are noted. No intra or extrahepatic biliary ductal dilatation. Gallbladder is normal in appearance. Pancreas: No pancreatic mass. No pancreatic ductal dilatation. No pancreatic or peripancreatic fluid or inflammatory changes. Spleen: Unremarkable. Adrenals/Urinary Tract: Bilateral adrenal glands and bilateral kidneys are normal in appearance. No hydroureteronephrosis. Urinary bladder is normal in appearance. Stomach/Bowel: Normal appearance of the stomach. No pathologic dilatation of small bowel or colon. Profound mass-like thickening of the distal rectum which measures up to 4.8 x 4.2 cm immediately above the anal verge (axial image 113 of series 2). Prominent but nonenlarged right-sided mesorectal lymph node measuring 6 mm in short axis. Normal appendix. Vascular/Lymphatic: No significant atherosclerotic disease, aneurysm or dissection identified in the abdominal or pelvic vasculature. Multiple enlarged enhancing left inguinal lymph nodes measuring up to 2.6 x 3.2 cm (axial image 109 of series 2). Enlarged 1.5 cm short axis left external iliac node (image 94 of series 2). Left internal iliac lymph node measuring 1.1 cm in short axis (image 101 of series 2). Reproductive: Uterus and ovaries are atrophic.  Other: No significant volume of ascites.  No pneumoperitoneum. Musculoskeletal: There are no aggressive appearing lytic or blastic lesions noted in the visualized portions of the skeleton. IMPRESSION: 1. Large distal rectal mass with extensive pelvic lymphadenopathy, as detailed above, compatible with nodal metastatic disease. 2. No other distant metastatic disease noted elsewhere in the abdomen or pelvis. 3. Tiny hypervascular lesion in segment 6 of the liver. This is indeterminate, but favored to be benign, likely a small flash fill cavernous hemangioma or benign perfusion anomaly. This would require MRI of the abdomen with and without IV gadolinium for definitive characterization if clinically appropriate. 4. Additional incidental findings, as above. Electronically Signed   By: Vinnie Langton M.D.   On: 12/28/2016 13:58   Ct Abdomen Pelvis W Contrast  Result Date: 12/28/2016 CLINICAL DATA:  66 year old female with history of recently discovered rectal cancer on colonoscopy 12/24/2016. Rectal bleeding. EXAM: CT CHEST, ABDOMEN, AND PELVIS WITH CONTRAST TECHNIQUE: Multidetector CT imaging of the chest, abdomen and pelvis was performed following the standard protocol  during bolus administration of intravenous contrast. CONTRAST:  134mL ISOVUE-300 IOPAMIDOL (ISOVUE-300) INJECTION 61% COMPARISON:  None. FINDINGS: CT CHEST FINDINGS Cardiovascular: Heart size is normal. There is no significant pericardial fluid, thickening or pericardial calcification. No atherosclerotic calcifications identified in the thoracic aorta or the coronary arteries. Mediastinum/Nodes: No pathologically enlarged mediastinal or hilar lymph nodes. Esophagus is unremarkable in appearance. No axillary lymphadenopathy. Lungs/Pleura: No suspicious appearing pulmonary nodules or masses. No acute consolidative airspace disease. No pleural effusions. Musculoskeletal: There are no aggressive appearing lytic or blastic lesions noted in the  visualized portions of the skeleton. CT ABDOMEN PELVIS FINDINGS Hepatobiliary: Well-circumscribed 1.5 cm low-attenuation lesion in segment 8 of the liver is compatible with a simple cyst. 6 mm hypervascular lesion in segment 6 of the liver (image 56 of series 2) is incompletely characterize, but favored to represent a small flash fill cavernous hemangioma or perfusion anomaly. No other suspicious hepatic lesions are noted. No intra or extrahepatic biliary ductal dilatation. Gallbladder is normal in appearance. Pancreas: No pancreatic mass. No pancreatic ductal dilatation. No pancreatic or peripancreatic fluid or inflammatory changes. Spleen: Unremarkable. Adrenals/Urinary Tract: Bilateral adrenal glands and bilateral kidneys are normal in appearance. No hydroureteronephrosis. Urinary bladder is normal in appearance. Stomach/Bowel: Normal appearance of the stomach. No pathologic dilatation of small bowel or colon. Profound mass-like thickening of the distal rectum which measures up to 4.8 x 4.2 cm immediately above the anal verge (axial image 113 of series 2). Prominent but nonenlarged right-sided mesorectal lymph node measuring 6 mm in short axis. Normal appendix. Vascular/Lymphatic: No significant atherosclerotic disease, aneurysm or dissection identified in the abdominal or pelvic vasculature. Multiple enlarged enhancing left inguinal lymph nodes measuring up to 2.6 x 3.2 cm (axial image 109 of series 2). Enlarged 1.5 cm short axis left external iliac node (image 94 of series 2). Left internal iliac lymph node measuring 1.1 cm in short axis (image 101 of series 2). Reproductive: Uterus and ovaries are atrophic. Other: No significant volume of ascites.  No pneumoperitoneum. Musculoskeletal: There are no aggressive appearing lytic or blastic lesions noted in the visualized portions of the skeleton. IMPRESSION: 1. Large distal rectal mass with extensive pelvic lymphadenopathy, as detailed above, compatible with nodal  metastatic disease. 2. No other distant metastatic disease noted elsewhere in the abdomen or pelvis. 3. Tiny hypervascular lesion in segment 6 of the liver. This is indeterminate, but favored to be benign, likely a small flash fill cavernous hemangioma or benign perfusion anomaly. This would require MRI of the abdomen with and without IV gadolinium for definitive characterization if clinically appropriate. 4. Additional incidental findings, as above. Electronically Signed   By: Vinnie Langton M.D.   On: 12/28/2016 13:58       IMPRESSION/PLAN: 1. 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 The patient is awaiting PET scan, however her CT imaging is suspicious for T2N1c disease. Dr. Lisbeth Renshaw reviews the findings from her images, as well as pathology, and her examination findings. We discussed that we would like to confirm her disease burden with her PET scan, and our scheduler has arranged for this to occur on 01/17/17. Dr. Lisbeth Renshaw recommends a course of concurrent chemotherapy and radiotherapy once her staging has been confirmed. He would recommend a course of about 6 weeks of treatment to the primary tumor and nodal beds. We discussed the risks, benefits, short, and long term effects of radiotherapy, and the patient is interested in proceeding. Written consent is obtained with the assistance of the interpreter. She  will proceed today with CT simulation.   The above documentation reflects my direct findings during this shared patient visit. Please see the separate note by Dr. Lisbeth Renshaw on this date for the remainder of the patient's plan of care.    Carola Rhine, PAC

## 2017-01-11 LAB — CYTOLOGY - PAP
DIAGNOSIS: NEGATIVE
HPV: NOT DETECTED

## 2017-01-12 ENCOUNTER — Other Ambulatory Visit (HOSPITAL_COMMUNITY): Payer: BLUE CROSS/BLUE SHIELD

## 2017-01-12 ENCOUNTER — Telehealth: Payer: Self-pay | Admitting: *Deleted

## 2017-01-12 NOTE — Telephone Encounter (Signed)
Called patient's home left voice message for son Gershon Mussel, that test results of patient's pap and HPV both negative, no need to repeat ,can call for any questions  9:05 AM

## 2017-01-15 ENCOUNTER — Ambulatory Visit: Payer: BLUE CROSS/BLUE SHIELD

## 2017-01-15 ENCOUNTER — Ambulatory Visit
Admission: RE | Admit: 2017-01-15 | Payer: BLUE CROSS/BLUE SHIELD | Source: Ambulatory Visit | Admitting: Radiation Oncology

## 2017-01-17 ENCOUNTER — Ambulatory Visit (HOSPITAL_COMMUNITY)
Admission: RE | Admit: 2017-01-17 | Discharge: 2017-01-17 | Disposition: A | Discharge status: Home / Self Care | Payer: BLUE CROSS/BLUE SHIELD | Source: Ambulatory Visit | Attending: Radiation Oncology | Admitting: Radiation Oncology

## 2017-01-17 ENCOUNTER — Ambulatory Visit: Payer: BLUE CROSS/BLUE SHIELD | Admitting: Radiation Oncology

## 2017-01-17 DIAGNOSIS — R590 Localized enlarged lymph nodes: Secondary | ICD-10-CM | POA: Diagnosis not present

## 2017-01-17 DIAGNOSIS — C210 Malignant neoplasm of anus, unspecified: Secondary | ICD-10-CM | POA: Diagnosis present

## 2017-01-17 LAB — GLUCOSE, CAPILLARY: Glucose-Capillary: 85 mg/dL (ref 65–99)

## 2017-01-17 MED ORDER — FLUDEOXYGLUCOSE F - 18 (FDG) INJECTION
7.1000 | Freq: Once | INTRAVENOUS | Status: AC | PRN
  Administered 2017-01-17: 7.1 via INTRAVENOUS

## 2017-01-19 ENCOUNTER — Other Ambulatory Visit: Payer: Self-pay | Admitting: *Deleted

## 2017-01-19 DIAGNOSIS — R300 Dysuria: Secondary | ICD-10-CM | POA: Diagnosis not present

## 2017-01-19 MED ORDER — PROCHLORPERAZINE MALEATE 10 MG PO TABS: 10.0000 mg | ORAL_TABLET | Freq: Four times a day (QID) | ORAL | 0 refills | Status: DC | PRN

## 2017-01-21 ENCOUNTER — Other Ambulatory Visit: Payer: Self-pay | Admitting: Oncology

## 2017-01-22 ENCOUNTER — Other Ambulatory Visit: Payer: Self-pay | Admitting: Oncology

## 2017-01-22 ENCOUNTER — Encounter: Payer: Self-pay | Admitting: Radiation Oncology

## 2017-01-22 ENCOUNTER — Ambulatory Visit
Admission: RE | Admit: 2017-01-22 | Discharge: 2017-01-22 | Disposition: A | Discharge status: Home / Self Care | Payer: BLUE CROSS/BLUE SHIELD | Source: Ambulatory Visit | Attending: Radiation Oncology | Admitting: Radiation Oncology

## 2017-01-22 ENCOUNTER — Ambulatory Visit (HOSPITAL_COMMUNITY)
Admission: RE | Admit: 2017-01-22 | Discharge: 2017-01-22 | Disposition: A | Discharge status: Home / Self Care | Payer: BLUE CROSS/BLUE SHIELD | Source: Ambulatory Visit | Attending: Oncology | Admitting: Oncology

## 2017-01-22 ENCOUNTER — Encounter (HOSPITAL_COMMUNITY): Payer: Self-pay | Admitting: Interventional Radiology

## 2017-01-22 ENCOUNTER — Ambulatory Visit (HOSPITAL_BASED_OUTPATIENT_CLINIC_OR_DEPARTMENT_OTHER): Payer: BLUE CROSS/BLUE SHIELD

## 2017-01-22 VITALS — BP 147/91 | HR 77 | Temp 98.1°F | Resp 16

## 2017-01-22 DIAGNOSIS — C210 Malignant neoplasm of anus, unspecified: Secondary | ICD-10-CM | POA: Insufficient documentation

## 2017-01-22 DIAGNOSIS — C774 Secondary and unspecified malignant neoplasm of inguinal and lower limb lymph nodes: Secondary | ICD-10-CM

## 2017-01-22 DIAGNOSIS — Z5111 Encounter for antineoplastic chemotherapy: Secondary | ICD-10-CM

## 2017-01-22 DIAGNOSIS — R300 Dysuria: Secondary | ICD-10-CM | POA: Diagnosis not present

## 2017-01-22 HISTORY — PX: IR GENERIC HISTORICAL: IMG1180011

## 2017-01-22 MED ORDER — LIDOCAINE HCL 1 % IJ SOLN
INTRAMUSCULAR | Status: DC | PRN
  Administered 2017-01-22: 5 mL

## 2017-01-22 MED ORDER — MITOMYCIN CHEMO IV INJECTION 20 MG
9.7000 mg/m2 | Freq: Once | INTRAVENOUS | Status: AC
  Administered 2017-01-22: 15 mg via INTRAVENOUS
  Filled 2017-01-22: qty 30

## 2017-01-22 MED ORDER — HEPARIN SOD (PORK) LOCK FLUSH 100 UNIT/ML IV SOLN
250.0000 [IU] | Freq: Once | INTRAVENOUS | Status: DC | PRN
  Filled 2017-01-22: qty 5

## 2017-01-22 MED ORDER — FLUOROURACIL CHEMO INJECTION 5 GM/100ML
1000.0000 mg/m2/d | INTRAVENOUS | Status: DC
  Administered 2017-01-22: 6300 mg via INTRAVENOUS
  Filled 2017-01-22: qty 126

## 2017-01-22 MED ORDER — PROCHLORPERAZINE MALEATE 10 MG PO TABS
10.0000 mg | ORAL_TABLET | Freq: Once | ORAL | Status: AC
  Administered 2017-01-22: 10 mg via ORAL

## 2017-01-22 MED ORDER — PROCHLORPERAZINE MALEATE 10 MG PO TABS
ORAL_TABLET | ORAL | Status: AC
  Filled 2017-01-22: qty 1

## 2017-01-22 MED ORDER — SODIUM CHLORIDE 0.9 % IV SOLN
Freq: Once | INTRAVENOUS | Status: AC
  Administered 2017-01-22: 12:00:00 via INTRAVENOUS

## 2017-01-22 MED ORDER — SODIUM CHLORIDE 0.9% FLUSH
10.0000 mL | INTRAVENOUS | Status: DC | PRN
Start: 2017-01-22 — End: 2017-01-22
  Filled 2017-01-22: qty 10

## 2017-01-22 MED ORDER — LIDOCAINE HCL 1 % IJ SOLN
INTRAMUSCULAR | Status: AC
  Filled 2017-01-22: qty 20

## 2017-01-22 NOTE — Progress Notes (Signed)
Financial Counselor--spoke with patient and daughter today--she is  interested in applying for the [employer], will bring letter of support this week

## 2017-01-22 NOTE — Patient Instructions (Signed)
[facility] Discharge Instructions for Patients Receiving Chemotherapy  Today you received the following chemotherapy agents Mitomycin & Fluorouracil  To help prevent nausea and vomiting after your treatment, we encourage you to take your nausea medication as directed.  If you develop nausea and vomiting that is not controlled by your nausea medication, call the clinic.   BELOW ARE SYMPTOMS THAT SHOULD BE REPORTED IMMEDIATELY:  *FEVER GREATER THAN 100.5 F  *CHILLS WITH OR WITHOUT FEVER  NAUSEA AND VOMITING THAT IS NOT CONTROLLED WITH YOUR NAUSEA MEDICATION  *UNUSUAL SHORTNESS OF BREATH  *UNUSUAL BRUISING OR BLEEDING  TENDERNESS IN MOUTH AND THROAT WITH OR WITHOUT PRESENCE OF ULCERS  *URINARY PROBLEMS  *BOWEL PROBLEMS  UNUSUAL RASH Items with * indicate a potential emergency and should be followed up as soon as possible.  Feel free to call the clinic you have any questions or concerns. The clinic phone number is [phone].  Please show the [place] at check-in to the Emergency Department and triage nurse.  Mitomycin injection What is this medicine? MITOMYCIN (mye toe MYE sin) is a chemotherapy drug. This medicine is used to treat cancer of the stomach and pancreas. This medicine may be used for other purposes; ask your health care provider or pharmacist if you have questions. COMMON BRAND NAME(S): Mutamycin What should I tell my health care provider before I take this medicine? They need to know if you have any of these conditions: -anemia -bleeding disorder -infection (especially a virus infection such as chickenpox, cold sores, or herpes) -kidney disease -low blood counts like low platelets, red blood cells, white blood cells -recent radiation therapy -an unusual or allergic reaction to mitomycin, other chemotherapy agents, other medicines, foods, dyes, or preservatives -pregnant or trying to get pregnant -breast-feeding How should I use  this medicine? This drug is given as an injection or infusion into a vein. It is administered in a hospital or clinic by a specially trained health care professional. Talk to your pediatrician regarding the use of this medicine in children. Special care may be needed. Overdosage: If you think you have taken too much of this medicine contact a poison control center or emergency room at once. NOTE: This medicine is only for you. Do not share this medicine with others. What if I miss a dose? It is important not to miss your dose. Call your doctor or health care professional if you are unable to keep an appointment. What may interact with this medicine? -medicines to increase blood counts like filgrastim, pegfilgrastim, sargramostim -vaccines This list may not describe all possible interactions. Give your health care provider a list of all the medicines, herbs, non-prescription drugs, or dietary supplements you use. Also tell them if you smoke, drink alcohol, or use illegal drugs. Some items may interact with your medicine. What should I watch for while using this medicine? Your condition will be monitored carefully while you are receiving this medicine. You will need important blood work done while you are taking this medicine. This drug may make you feel generally unwell. This is not uncommon, as chemotherapy can affect healthy cells as well as cancer cells. Report any side effects. Continue your course of treatment even though you feel ill unless your doctor tells you to stop. Call your doctor or health care professional for advice if you get a fever, chills or sore throat, or other symptoms of a cold or flu. Do not treat yourself. This drug decreases your body's ability to fight  infections. Try to avoid being around people who are sick. This medicine may increase your risk to bruise or bleed. Call your doctor or health care professional if you notice any unusual bleeding. Be careful brushing and  flossing your teeth or using a toothpick because you may get an infection or bleed more easily. If you have any dental work done, tell your dentist you are receiving this medicine. Avoid taking products that contain aspirin, acetaminophen, ibuprofen, naproxen, or ketoprofen unless instructed by your doctor. These medicines may hide a fever. Do not become pregnant while taking this medicine. Women should inform their doctor if they wish to become pregnant or think they might be pregnant. There is a potential for serious side effects to an unborn child. Talk to your health care professional or pharmacist for more information. Do not breast-feed an infant while taking this medicine. What side effects may I notice from receiving this medicine? Side effects that you should report to your doctor or health care professional as soon as possible: -allergic reactions like skin rash, itching or hives, swelling of the face, lips, or tongue -low blood counts - this medicine may decrease the number of white blood cells, red blood cells and platelets. You may be at increased risk for infections and bleeding. -signs of infection - fever or chills, cough, sore throat, pain or difficulty passing urine -signs of decreased platelets or bleeding - bruising, pinpoint red spots on the skin, black, tarry stools, blood in the urine -signs of decreased red blood cells - unusually weak or tired, fainting spells, lightheadedness -breathing problems -changes in vision -chest pain -confusion -dry cough -high blood pressure -mouth sores -pain, swelling, redness at site where injected -pain, tingling, numbness in the hands or feet -seizures -swelling of the ankles, feet, hands -trouble passing urine or change in the amount of urine Side effects that usually do not require medical attention (report to your doctor or health care professional if they continue or are bothersome): -diarrhea -green to blue color of urine -hair  loss -loss of appetite -nausea, vomiting This list may not describe all possible side effects. Call your doctor for medical advice about side effects. You may report side effects to FDA at 1-800-FDA-1088. Where should I keep my medicine? This drug is given in a hospital or clinic and will not be stored at home. NOTE: This sheet is a summary. It may not cover all possible information. If you have questions about this medicine, talk to your doctor, pharmacist, or health care provider.  2018 Elsevier/Gold Standard (2008-04-30 11:16:23)   Fluorouracil, 5-FU injection What is this medicine? FLUOROURACIL, 5-FU (flure oh YOOR a sil) is a chemotherapy drug. It slows the growth of cancer cells. This medicine is used to treat many types of cancer like breast cancer, colon or rectal cancer, pancreatic cancer, and stomach cancer. This medicine may be used for other purposes; ask your health care provider or pharmacist if you have questions. COMMON BRAND NAME(S): Adrucil What should I tell my health care provider before I take this medicine? They need to know if you have any of these conditions: -blood disorders -dihydropyrimidine dehydrogenase (DPD) deficiency -infection (especially a virus infection such as chickenpox, cold sores, or herpes) -kidney disease -liver disease -malnourished, poor nutrition -recent or ongoing radiation therapy -an unusual or allergic reaction to fluorouracil, other chemotherapy, other medicines, foods, dyes, or preservatives -pregnant or trying to get pregnant -breast-feeding How should I use this medicine? This drug is given as an infusion  or injection into a vein. It is administered in a hospital or clinic by a specially trained health care professional. Talk to your pediatrician regarding the use of this medicine in children. Special care may be needed. Overdosage: If you think you have taken too much of this medicine contact a poison control center or emergency  room at once. NOTE: This medicine is only for you. Do not share this medicine with others. What if I miss a dose? It is important not to miss your dose. Call your doctor or health care professional if you are unable to keep an appointment. What may interact with this medicine? -allopurinol -cimetidine -dapsone -digoxin -hydroxyurea -leucovorin -levamisole -medicines for seizures like ethotoin, fosphenytoin, phenytoin -medicines to increase blood counts like filgrastim, pegfilgrastim, sargramostim -medicines that treat or prevent blood clots like warfarin, enoxaparin, and dalteparin -methotrexate -metronidazole -pyrimethamine -some other chemotherapy drugs like busulfan, cisplatin, estramustine, vinblastine -trimethoprim -trimetrexate -vaccines Talk to your doctor or health care professional before taking any of these medicines: -acetaminophen -aspirin -ibuprofen -ketoprofen -naproxen This list may not describe all possible interactions. Give your health care provider a list of all the medicines, herbs, non-prescription drugs, or dietary supplements you use. Also tell them if you smoke, drink alcohol, or use illegal drugs. Some items may interact with your medicine. What should I watch for while using this medicine? Visit your doctor for checks on your progress. This drug may make you feel generally unwell. This is not uncommon, as chemotherapy can affect healthy cells as well as cancer cells. Report any side effects. Continue your course of treatment even though you feel ill unless your doctor tells you to stop. In some cases, you may be given additional medicines to help with side effects. Follow all directions for their use. Call your doctor or health care professional for advice if you get a fever, chills or sore throat, or other symptoms of a cold or flu. Do not treat yourself. This drug decreases your body's ability to fight infections. Try to avoid being around people who are  sick. This medicine may increase your risk to bruise or bleed. Call your doctor or health care professional if you notice any unusual bleeding. Be careful brushing and flossing your teeth or using a toothpick because you may get an infection or bleed more easily. If you have any dental work done, tell your dentist you are receiving this medicine. Avoid taking products that contain aspirin, acetaminophen, ibuprofen, naproxen, or ketoprofen unless instructed by your doctor. These medicines may hide a fever. Do not become pregnant while taking this medicine. Women should inform their doctor if they wish to become pregnant or think they might be pregnant. There is a potential for serious side effects to an unborn child. Talk to your health care professional or pharmacist for more information. Do not breast-feed an infant while taking this medicine. Men should inform their doctor if they wish to father a child. This medicine may lower sperm counts. Do not treat diarrhea with over the counter products. Contact your doctor if you have diarrhea that lasts more than 2 days or if it is severe and watery. This medicine can make you more sensitive to the sun. Keep out of the sun. If you cannot avoid being in the sun, wear protective clothing and use sunscreen. Do not use sun lamps or tanning beds/booths. What side effects may I notice from receiving this medicine? Side effects that you should report to your doctor or health care professional as  soon as possible: -allergic reactions like skin rash, itching or hives, swelling of the face, lips, or tongue -low blood counts - this medicine may decrease the number of white blood cells, red blood cells and platelets. You may be at increased risk for infections and bleeding. -signs of infection - fever or chills, cough, sore throat, pain or difficulty passing urine -signs of decreased platelets or bleeding - bruising, pinpoint red spots on the skin, black, tarry stools,  blood in the urine -signs of decreased red blood cells - unusually weak or tired, fainting spells, lightheadedness -breathing problems -changes in vision -chest pain -mouth sores -nausea and vomiting -pain, swelling, redness at site where injected -pain, tingling, numbness in the hands or feet -redness, swelling, or sores on hands or feet -stomach pain -unusual bleeding Side effects that usually do not require medical attention (report to your doctor or health care professional if they continue or are bothersome): -changes in finger or toe nails -diarrhea -dry or itchy skin -hair loss -headache -loss of appetite -sensitivity of eyes to the light -stomach upset -unusually teary eyes This list may not describe all possible side effects. Call your doctor for medical advice about side effects. You may report side effects to FDA at 1-800-FDA-1088. Where should I keep my medicine? This drug is given in a hospital or clinic and will not be stored at home. NOTE: This sheet is a summary. It may not cover all possible information. If you have questions about this medicine, talk to your doctor, pharmacist, or health care provider.  2018 Elsevier/Gold Standard (2008-02-26 13:53:16)

## 2017-01-22 NOTE — Procedures (Signed)
Interventional Radiology Procedure Note  Procedure: Placement of a right basilic vein approach single lumen PowerPicc.  Tip is positioned at the superior cavoatrial junction and catheter is ready for immediate use.  Complications: None Recommendations:  - Ok to shower tomorrow - Do not submerge  - Routine line care   Signed,  Dulcy Fanny. Earleen Newport, DO

## 2017-01-23 ENCOUNTER — Ambulatory Visit
Admission: RE | Admit: 2017-01-23 | Discharge: 2017-01-23 | Disposition: A | Discharge status: Home / Self Care | Payer: BLUE CROSS/BLUE SHIELD | Source: Ambulatory Visit | Attending: Radiation Oncology | Admitting: Radiation Oncology

## 2017-01-23 ENCOUNTER — Ambulatory Visit: Payer: BLUE CROSS/BLUE SHIELD

## 2017-01-23 DIAGNOSIS — R300 Dysuria: Secondary | ICD-10-CM | POA: Diagnosis not present

## 2017-01-23 DIAGNOSIS — Z95828 Presence of other vascular implants and grafts: Secondary | ICD-10-CM | POA: Insufficient documentation

## 2017-01-23 DIAGNOSIS — C210 Malignant neoplasm of anus, unspecified: Secondary | ICD-10-CM

## 2017-01-23 MED ORDER — SODIUM CHLORIDE 0.9% FLUSH
10.0000 mL | INTRAVENOUS | Status: DC | PRN
  Filled 2017-01-23: qty 10

## 2017-01-23 MED ORDER — HEPARIN SOD (PORK) LOCK FLUSH 100 UNIT/ML IV SOLN
500.0000 [IU] | Freq: Once | INTRAVENOUS | Status: DC | PRN
  Filled 2017-01-23: qty 5

## 2017-01-24 ENCOUNTER — Other Ambulatory Visit: Payer: BLUE CROSS/BLUE SHIELD

## 2017-01-24 ENCOUNTER — Ambulatory Visit
Admission: RE | Admit: 2017-01-24 | Discharge: 2017-01-24 | Disposition: A | Discharge status: Home / Self Care | Payer: BLUE CROSS/BLUE SHIELD | Source: Ambulatory Visit | Attending: Radiation Oncology | Admitting: Radiation Oncology

## 2017-01-24 ENCOUNTER — Ambulatory Visit: Payer: BLUE CROSS/BLUE SHIELD | Admitting: Nurse Practitioner

## 2017-01-24 DIAGNOSIS — R300 Dysuria: Secondary | ICD-10-CM | POA: Diagnosis not present

## 2017-01-25 ENCOUNTER — Ambulatory Visit
Admission: RE | Admit: 2017-01-25 | Discharge: 2017-01-25 | Disposition: A | Discharge status: Home / Self Care | Payer: BLUE CROSS/BLUE SHIELD | Source: Ambulatory Visit | Attending: Radiation Oncology | Admitting: Radiation Oncology

## 2017-01-25 DIAGNOSIS — R300 Dysuria: Secondary | ICD-10-CM | POA: Diagnosis not present

## 2017-01-26 ENCOUNTER — Ambulatory Visit: Payer: BLUE CROSS/BLUE SHIELD

## 2017-01-26 ENCOUNTER — Ambulatory Visit
Admission: RE | Admit: 2017-01-26 | Discharge: 2017-01-26 | Disposition: A | Discharge status: Home / Self Care | Payer: BLUE CROSS/BLUE SHIELD | Source: Ambulatory Visit | Attending: Radiation Oncology | Admitting: Radiation Oncology

## 2017-01-26 VITALS — BP 110/63 | HR 89 | Temp 98.2°F | Resp 16

## 2017-01-26 DIAGNOSIS — R300 Dysuria: Secondary | ICD-10-CM | POA: Diagnosis not present

## 2017-01-26 DIAGNOSIS — C210 Malignant neoplasm of anus, unspecified: Secondary | ICD-10-CM

## 2017-01-26 MED ORDER — SODIUM CHLORIDE 0.9% FLUSH
10.0000 mL | INTRAVENOUS | Status: DC | PRN
  Administered 2017-01-26: 10 mL
  Filled 2017-01-26: qty 10

## 2017-01-26 NOTE — Patient Instructions (Signed)
PICC Removal, Care After Refer to this sheet in the next few weeks. These instructions provide you with information on caring for yourself after your procedure. Your health care provider may also give you more specific instructions. Your treatment has been planned according to current medical practices, but problems sometimes occur. Call your health care provider if you have any problems or questions after your procedure. What can I expect after the procedure? After your procedure, it is typical to have mild discomfort at the insertion site. This should not last for more than a day. Follow these instructions at home: You may remove the bandage after 24 hours. The PICC insertion site is very small. A small scab may develop over the insertion site. It is okay to wash the site gently with soap and water. Be careful not to remove or pick off the scab. Gently pat the site dry after washing it. You do not need to put another bandage over the insertion site. Do not lift anything heavy or do strenuous physical activity for 24 hours after the PICC is removed. This includes:  Weight lifting.  Strenuous yard work.  Any physical activity with repetitive arm movement.  Contact a health care provider if:  You have swelling or puffiness in your arm at the PICC insertion site.  You have increasing tenderness at the PICC insertion site. Get help right away if:  You have numbness or tingling in your fingers, hand, or arm.  Your arm looks blue and feels cold.  You have redness around the insertion site or a red streak goes up your arm.  You have any type of drainage from the PICC insertion site. This includes drainage such as: ? Bleeding from the insertion site. If this happens, apply firm, direct pressure to the PICC insertion site with a clean towel. ? Drainage that is yellow or tan.  You have a fever. This information is not intended to replace advice given to you by your health care provider. Make  sure you discuss any questions you have with your health care provider. Document Released: 10/28/2013 Document Revised: 03/30/2016 Document Reviewed: 08/15/2013 Elsevier Interactive Patient Education  2017 [employer]vier Inc.  

## 2017-01-26 NOTE — Progress Notes (Signed)
  Radiation Oncology         [phone] ________________________________  Name: Anna Lawrence MRN: [id number]  Date: 01/10/2017  DOB: 1950-12-26  SIMULATION AND TREATMENT PLANNING NOTE   DIAGNOSIS:     ICD-9-CM ICD-10-CM   1. Special screening for malignant neoplasms, colon V76.51 Z12.11 Pap IG and HPV (high risk) DNA detection     NM PET Image Initial (PI) Skull Base To Thigh     MM DIGITAL SCREENING BILATERAL  2. 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 This complex treatment device will be used on a daily basis during the patient's treatment.   Diagnosis: Anal cancer   NARRATIVE: The patient was brought to the [facility]. Identity was confirmed. All relevant records and images related to the planned course of therapy were reviewed. Then, the patient was positioned in a stable reproducible clinical set-up for radiation therapy using a customized vac lock bag. Skin markings were placed. The CT images were loaded into the planning software where the target and avoidance structures were contoured.The radiation prescription was entered and confirmed.   The patient will receive 54 Gy in 30 fractions to the high-dose target region.  Daily image guidance is ordered, and this will be used on a daily basis. This is necessary to ensure accurate and precise localization of the target in addition to accurate alignment of the normal tissue structures in this region. This is particularly important given the possible motion of the high-dose target.  Treatment planning then occurred.   I have requested : Intensity Modulated Radiotherapy (IMRT) is medically necessary for this case for the following reason: Dose homogeneity; the target is in close proximity to critical normal structures, including the femoral heads, bladder, and small bowel. IMRT  is thus medically to appropriately treat the patient.   Special treatment procedure  The patient will receive chemotherapy during the course of radiation treatment. The patient may experience increased or overlapping toxicity due to this combined-modality approach and the patient will be monitored for such problems. This may include extra lab  work as necessary. This therefore constitutes a special treatment procedure.     ________________________________  Jodelle Gross, MD, PhD

## 2017-01-29 ENCOUNTER — Other Ambulatory Visit: Payer: Self-pay | Admitting: *Deleted

## 2017-01-29 ENCOUNTER — Ambulatory Visit (HOSPITAL_BASED_OUTPATIENT_CLINIC_OR_DEPARTMENT_OTHER): Payer: BLUE CROSS/BLUE SHIELD | Admitting: Nurse Practitioner

## 2017-01-29 ENCOUNTER — Ambulatory Visit (HOSPITAL_BASED_OUTPATIENT_CLINIC_OR_DEPARTMENT_OTHER): Payer: BLUE CROSS/BLUE SHIELD

## 2017-01-29 ENCOUNTER — Ambulatory Visit
Admission: RE | Admit: 2017-01-29 | Discharge: 2017-01-29 | Disposition: A | Discharge status: Home / Self Care | Payer: BLUE CROSS/BLUE SHIELD | Source: Ambulatory Visit | Attending: Radiation Oncology | Admitting: Radiation Oncology

## 2017-01-29 VITALS — BP 143/77 | HR 72 | Temp 98.0°F | Resp 18 | Ht 62.0 in | Wt 119.9 lb

## 2017-01-29 DIAGNOSIS — C210 Malignant neoplasm of anus, unspecified: Secondary | ICD-10-CM | POA: Diagnosis not present

## 2017-01-29 DIAGNOSIS — D61810 Antineoplastic chemotherapy induced pancytopenia: Secondary | ICD-10-CM | POA: Diagnosis not present

## 2017-01-29 DIAGNOSIS — R300 Dysuria: Secondary | ICD-10-CM | POA: Diagnosis not present

## 2017-01-29 DIAGNOSIS — C774 Secondary and unspecified malignant neoplasm of inguinal and lower limb lymph nodes: Secondary | ICD-10-CM | POA: Diagnosis not present

## 2017-01-29 DIAGNOSIS — R5081 Fever presenting with conditions classified elsewhere: Secondary | ICD-10-CM

## 2017-01-29 DIAGNOSIS — K209 Esophagitis, unspecified without bleeding: Secondary | ICD-10-CM

## 2017-01-29 DIAGNOSIS — T451X5A Adverse effect of antineoplastic and immunosuppressive drugs, initial encounter: Secondary | ICD-10-CM

## 2017-01-29 LAB — COMPREHENSIVE METABOLIC PANEL
ALBUMIN: 3.9 g/dL (ref 3.5–5.0)
ALK PHOS: 63 U/L (ref 40–150)
ALT: 14 U/L (ref 0–55)
ANION GAP: 9 meq/L (ref 3–11)
AST: 18 U/L (ref 5–34)
BUN: 23.4 mg/dL (ref 7.0–26.0)
CO2: 23 mEq/L (ref 22–29)
CREATININE: 1 mg/dL (ref 0.6–1.1)
Calcium: 9.7 mg/dL (ref 8.4–10.4)
Chloride: 107 mEq/L (ref 98–109)
EGFR: 58 mL/min/{1.73_m2} — ABNORMAL LOW (ref >90)
Glucose: 92 mg/dl (ref 70–140)
Potassium: 4.9 mEq/L (ref 3.5–5.1)
Sodium: 139 mEq/L (ref 136–145)
TOTAL PROTEIN: 7.6 g/dL (ref 6.4–8.3)
Total Bilirubin: 0.46 mg/dL (ref 0.20–1.20)

## 2017-01-29 LAB — CBC WITH DIFFERENTIAL/PLATELET
BASO%: 0.5 % (ref 0.0–2.0)
Basophils Absolute: 0 10*3/uL (ref 0.0–0.1)
EOS%: 2.5 % (ref 0.0–7.0)
Eosinophils Absolute: 0 10*3/uL (ref 0.0–0.5)
HCT: 27 % — ABNORMAL LOW (ref 34.8–46.6)
HGB: 8.5 g/dL — ABNORMAL LOW (ref 11.6–15.9)
LYMPH%: 22.2 % (ref 14.0–49.7)
MCH: 25.4 pg (ref 25.1–34.0)
MCHC: 31.4 g/dL — ABNORMAL LOW (ref 31.5–36.0)
MCV: 80.8 fL (ref 79.5–101.0)
MONO#: 0 10*3/uL — ABNORMAL LOW (ref 0.1–0.9)
MONO%: 1.6 % (ref 0.0–14.0)
NEUT#: 1.2 10*3/uL — ABNORMAL LOW (ref 1.5–6.5)
NEUT%: 73.2 % (ref 38.4–76.8)
Platelets: 235 10*3/uL (ref 145–400)
RBC: 3.34 10*6/uL — ABNORMAL LOW (ref 3.70–5.45)
RDW: 14.5 % (ref 11.2–14.5)
WBC: 1.7 10*3/uL — ABNORMAL LOW (ref 3.9–10.3)
lymph#: 0.4 10*3/uL — ABNORMAL LOW (ref 0.9–3.3)

## 2017-01-29 MED ORDER — MAGIC MOUTHWASH: 5.0000 mL | Freq: Four times a day (QID) | ORAL | 1 refills | Status: DC | PRN

## 2017-01-29 MED FILL — DUKE'S MOUTHWASH: 6 days supply | Qty: 120 | Fill #0

## 2017-01-30 ENCOUNTER — Ambulatory Visit
Admission: RE | Admit: 2017-01-30 | Discharge: 2017-01-30 | Disposition: A | Discharge status: Home / Self Care | Payer: BLUE CROSS/BLUE SHIELD | Source: Ambulatory Visit | Attending: Radiation Oncology | Admitting: Radiation Oncology

## 2017-01-30 DIAGNOSIS — R300 Dysuria: Secondary | ICD-10-CM | POA: Diagnosis not present

## 2017-01-31 ENCOUNTER — Telehealth: Payer: Self-pay | Admitting: Oncology

## 2017-01-31 ENCOUNTER — Other Ambulatory Visit (HOSPITAL_BASED_OUTPATIENT_CLINIC_OR_DEPARTMENT_OTHER): Payer: BLUE CROSS/BLUE SHIELD

## 2017-01-31 ENCOUNTER — Ambulatory Visit
Admission: RE | Admit: 2017-01-31 | Discharge: 2017-01-31 | Disposition: A | Discharge status: Home / Self Care | Payer: BLUE CROSS/BLUE SHIELD | Source: Ambulatory Visit | Attending: Radiation Oncology | Admitting: Radiation Oncology

## 2017-01-31 ENCOUNTER — Encounter: Payer: Self-pay | Admitting: Nurse Practitioner

## 2017-01-31 ENCOUNTER — Ambulatory Visit (HOSPITAL_BASED_OUTPATIENT_CLINIC_OR_DEPARTMENT_OTHER): Payer: BLUE CROSS/BLUE SHIELD | Admitting: Nurse Practitioner

## 2017-01-31 VITALS — BP 124/74 | HR 83 | Temp 98.7°F | Resp 18 | Ht 62.0 in | Wt 120.0 lb

## 2017-01-31 DIAGNOSIS — C210 Malignant neoplasm of anus, unspecified: Secondary | ICD-10-CM | POA: Diagnosis not present

## 2017-01-31 DIAGNOSIS — G893 Neoplasm related pain (acute) (chronic): Secondary | ICD-10-CM | POA: Diagnosis not present

## 2017-01-31 DIAGNOSIS — T451X5A Adverse effect of antineoplastic and immunosuppressive drugs, initial encounter: Secondary | ICD-10-CM | POA: Insufficient documentation

## 2017-01-31 DIAGNOSIS — D500 Iron deficiency anemia secondary to blood loss (chronic): Secondary | ICD-10-CM

## 2017-01-31 DIAGNOSIS — K209 Esophagitis, unspecified without bleeding: Secondary | ICD-10-CM | POA: Insufficient documentation

## 2017-01-31 DIAGNOSIS — D6481 Anemia due to antineoplastic chemotherapy: Secondary | ICD-10-CM | POA: Diagnosis not present

## 2017-01-31 DIAGNOSIS — D61810 Antineoplastic chemotherapy induced pancytopenia: Secondary | ICD-10-CM | POA: Insufficient documentation

## 2017-01-31 DIAGNOSIS — R300 Dysuria: Secondary | ICD-10-CM | POA: Diagnosis not present

## 2017-01-31 DIAGNOSIS — K1230 Oral mucositis (ulcerative), unspecified: Secondary | ICD-10-CM

## 2017-01-31 LAB — COMPREHENSIVE METABOLIC PANEL
ALBUMIN: 3.9 g/dL (ref 3.5–5.0)
ALT: 15 U/L (ref 0–55)
AST: 17 U/L (ref 5–34)
Alkaline Phosphatase: 61 U/L (ref 40–150)
Anion Gap: 9 mEq/L (ref 3–11)
BILIRUBIN TOTAL: 0.44 mg/dL (ref 0.20–1.20)
BUN: 19.4 mg/dL (ref 7.0–26.0)
CO2: 24 mEq/L (ref 22–29)
CREATININE: 1 mg/dL (ref 0.6–1.1)
Calcium: 9.4 mg/dL (ref 8.4–10.4)
Chloride: 107 mEq/L (ref 98–109)
EGFR: 63 mL/min/{1.73_m2} — ABNORMAL LOW (ref >90)
Glucose: 81 mg/dl (ref 70–140)
Potassium: 4.5 mEq/L (ref 3.5–5.1)
SODIUM: 139 meq/L (ref 136–145)
TOTAL PROTEIN: 7.6 g/dL (ref 6.4–8.3)

## 2017-01-31 LAB — CBC WITH DIFFERENTIAL/PLATELET
BASO%: 0.1 % (ref 0.0–2.0)
Basophils Absolute: 0 10*3/uL (ref 0.0–0.1)
EOS%: 3.9 % (ref 0.0–7.0)
Eosinophils Absolute: 0 10*3/uL (ref 0.0–0.5)
HCT: 25.2 % — ABNORMAL LOW (ref 34.8–46.6)
HGB: 8 g/dL — ABNORMAL LOW (ref 11.6–15.9)
LYMPH%: 17.3 % (ref 14.0–49.7)
MCH: 25.7 pg (ref 25.1–34.0)
MCHC: 31.8 g/dL (ref 31.5–36.0)
MCV: 80.6 fL (ref 79.5–101.0)
MONO#: 0 10*3/uL — AB (ref 0.1–0.9)
MONO%: 2.3 % (ref 0.0–14.0)
NEUT%: 76.4 % (ref 38.4–76.8)
NEUTROS ABS: 1 10*3/uL — AB (ref 1.5–6.5)
PLATELETS: 197 10*3/uL (ref 145–400)
RBC: 3.13 10*6/uL — AB (ref 3.70–5.45)
RDW: 14.2 % (ref 11.2–14.5)
WBC: 1.3 10*3/uL — AB (ref 3.9–10.3)
lymph#: 0.2 10*3/uL — ABNORMAL LOW (ref 0.9–3.3)

## 2017-01-31 NOTE — Progress Notes (Signed)
SYMPTOM MANAGEMENT CLINIC    Chief Complaint: Esophagitis  HPI:  Anna Lawrence 66 y.o. female diagnosed with anal cancer.  Currently on mitomycin/5FU chemotherapy regimen.  Also receives daily radiation treatments.    No history exists.    Review of Systems  Constitutional: Positive for malaise/fatigue.  HENT: Positive for sore throat.   All other systems reviewed and are negative.   Past Medical History:  Diagnosis Date  . Chicken pox   . Colon cancer ([place]) 12/25/2016   rectal/anus    Past Surgical History:  Procedure Laterality Date  . IR GENERIC HISTORICAL  01/22/2017   IR US GUIDE VASC ACCESS RIGHT 01/22/2017 Corrie Mckusick, DO [facility]  . IR GENERIC HISTORICAL  01/22/2017   IR FLUORO GUIDE CV LINE RIGHT 01/22/2017 Corrie Mckusick, DO [facility]  . TUBAL LIGATION      has Special screening for malignant neoplasms, colon; Pelvic mass in female; Hearing loss in right ear; Visit for preventive health examination; Breast cancer screening; Internal hemorrhoids; Anal cancer ([place]); Port catheter in place; Esophagitis; and Pancytopenia due to antineoplastic chemotherapy [facility]) on her problem list.    has No Known Allergies.  Allergies as of 01/29/2017   No Known Allergies     Medication List       Accurate as of 01/29/17 11:59 PM. Always use your most recent med list.          FISH OIL PO Take by mouth. Take 1 capsule by mouth 2 times daily.   magic mouthwash Soln Take 5 mLs by mouth 4 (four) times daily as needed for mouth pain.   polyethylene glycol powder powder Commonly known as:  GLYCOLAX/MIRALAX Take 34 g by mouth 2 (two) times daily.   prochlorperazine 10 MG tablet Commonly known as:  COMPAZINE Take 1 tablet (10 mg total) by mouth every 6 (six) hours as needed for nausea or vomiting.        PHYSICAL EXAMINATION  Oncology Vitals 01/31/2017 01/29/2017  Height 158 cm 158 cm  Weight 54.432 kg 54.386 kg  Weight (lbs) 120 lbs 119 lbs 14 oz  BMI  (kg/m2) 21.95 kg/m2 21.93 kg/m2  Temp 98.7 98  Pulse 83 72  Resp 18 18  SpO2 100 100  BSA (m2) 1.54 m2 1.54 m2   BP Readings from Last 2 Encounters:  01/31/17 124/74  01/29/17 (!) 143/77    Physical Exam  Constitutional: She is oriented to person, place, and time and well-developed, well-nourished, and in no distress.  HENT:  Head: Normocephalic and atraumatic.  Mouth/Throat: Oropharynx is clear and moist.  Tiny lesions underneath both the upper and lower dentures.  Posterior oropharynx appears normal.  Eyes: Conjunctivae and EOM are normal. Pupils are equal, round, and reactive to light. Right eye exhibits no discharge. Left eye exhibits no discharge. No scleral icterus.  Neck: Normal range of motion. Neck supple. No JVD present. No tracheal deviation present. No thyromegaly present.  Cardiovascular: Normal rate, regular rhythm, normal heart sounds and intact distal pulses.   Pulmonary/Chest: Effort normal and breath sounds normal. No respiratory distress. She has no wheezes. She has no rales. She exhibits no tenderness.  Abdominal: Soft. Bowel sounds are normal. She exhibits no distension and no mass. There is no tenderness. There is no rebound and no guarding.  Musculoskeletal: Normal range of motion. She exhibits no edema or tenderness.  Lymphadenopathy:    She has no cervical adenopathy.  Neurological: She is alert and oriented to person, place, and  time. Gait normal.  Skin: Skin is warm and dry. No rash noted. No erythema. No pallor.  Psychiatric: Affect normal.  Nursing note and vitals reviewed.   LABORATORY DATA:. Appointment on 01/29/2017  Component Date Value Ref Range Status  . WBC 01/29/2017 1.7* 3.9 - 10.3 10e3/uL Final  . NEUT# 01/29/2017 1.2* 1.5 - 6.5 10e3/uL Final  . HGB 01/29/2017 8.5* 11.6 - 15.9 g/dL Final  . HCT 01/29/2017 27.0* 34.8 - 46.6 % Final  . Platelets 01/29/2017 235  145 - 400 10e3/uL Final  . MCV 01/29/2017 80.8  79.5 - 101.0 fL Final  . MCH  01/29/2017 25.4  25.1 - 34.0 pg Final  . MCHC 01/29/2017 31.4* 31.5 - 36.0 g/dL Final  . RBC 01/29/2017 3.34* 3.70 - 5.45 10e6/uL Final  . RDW 01/29/2017 14.5  11.2 - 14.5 % Final  . lymph# 01/29/2017 0.4* 0.9 - 3.3 10e3/uL Final  . MONO# 01/29/2017 0.0* 0.1 - 0.9 10e3/uL Final  . Eosinophils Absolute 01/29/2017 0.0  0.0 - 0.5 10e3/uL Final  . Basophils Absolute 01/29/2017 0.0  0.0 - 0.1 10e3/uL Final  . NEUT% 01/29/2017 73.2  38.4 - 76.8 % Final  . LYMPH% 01/29/2017 22.2  14.0 - 49.7 % Final  . MONO% 01/29/2017 1.6  0.0 - 14.0 % Final  . EOS% 01/29/2017 2.5  0.0 - 7.0 % Final  . BASO% 01/29/2017 0.5  0.0 - 2.0 % Final  . Sodium 01/29/2017 139  136 - 145 mEq/L Final  . Potassium 01/29/2017 4.9  3.5 - 5.1 mEq/L Final  . Chloride 01/29/2017 107  98 - 109 mEq/L Final  . CO2 01/29/2017 23  22 - 29 mEq/L Final  . Glucose 01/29/2017 92  70 - 140 mg/dl Final  . BUN 01/29/2017 23.4  7.0 - 26.0 mg/dL Final  . Creatinine 01/29/2017 1.0  0.6 - 1.1 mg/dL Final  . Total Bilirubin 01/29/2017 0.46  0.20 - 1.20 mg/dL Final  . Alkaline Phosphatase 01/29/2017 63  40 - 150 U/L Final  . AST 01/29/2017 18  5 - 34 U/L Final  . ALT 01/29/2017 14  0 - 55 U/L Final  . Total Protein 01/29/2017 7.6  6.4 - 8.3 g/dL Final  . Albumin 01/29/2017 3.9  3.5 - 5.0 g/dL Final  . Calcium 01/29/2017 9.7  8.4 - 10.4 mg/dL Final  . Anion Gap 01/29/2017 9  3 - 11 mEq/L Final  . EGFR 01/29/2017 58* >90 ml/min/1.73 m2 Final    RADIOGRAPHIC STUDIES: No results found.  ASSESSMENT/PLAN:    Pancytopenia due to antineoplastic chemotherapy [facility]) Patient completed her first cycle of mitomycin/5FU chemotherapy regimen on 01/22/2017.  She also continues to receive daily radiation treatments.  Blood counts obtained today revealed a VBAC 1.7, ANC is 1.2, and hemoglobin has decreased down to 8.5.  Confirmed the patient does not have any active bleeding; other than some mild oozing of blood from the wound in the anal region when she  wipes.  Patient denies any shortness of breath with exertion.  Vital signs were stable today.  Patient was afebrile.  Patient does not appear toxic.  Briefly reviewed all neutropenia.  Patient's as well.  Reviewed all findings with Dr. Benay Spice; he recommended that patient return later this week for repeat labs and evaluation.  Esophagitis Patient presented to the [facility] today with complaint of some mild irritation underneath her upper and lower dentures; but most importantly she is complaining of some significant sore throat.  Exam today reveals some  very small lesions underneath her upper and lower dentures; but her posterior oropharynx is clear with no erythema or exudate.  Patient is observed, managing all oral secretions with no difficulty.  Most likely, patient is suffering with some chemotherapy therapy-induced esophagitis.  Patient was given instructions regarding the use of Biotene and will also be prescribed Magic mouthwash without lidocaine to see if this helps.    Anal cancer [facility]) Patient received her first cycle of mitomycin/5FU chemotherapy on 01/22/2017.  She continues to take radiation on a daily basis.  See further notes for details of today's visit.  Today's visit was in the presence of a translator.  Patient is scheduled to return on 01/31/2017 for labs and a follow-up visit.   Patient stated understanding of all instructions; and was in agreement with this plan of care. The patient knows to call the clinic with any problems, questions or concerns.   Total time spent with patient was 25 minutes;  with greater than 75 percent of that time spent in face to face counseling regarding patient's symptoms,  and coordination of care and follow up.  Disclaimer:This dictation was prepared with Dragon/digital dictation along with [employer]. Any transcriptional errors that result from this process are unintentional.  Drue Second, NP 01/31/2017

## 2017-01-31 NOTE — Assessment & Plan Note (Signed)
Patient completed her first cycle of mitomycin/5FU chemotherapy regimen on 01/22/2017.  She also continues to receive daily radiation treatments.  Blood counts obtained today revealed a VBAC 1.7, ANC is 1.2, and hemoglobin has decreased down to 8.5.  Confirmed the patient does not have any active bleeding; other than some mild oozing of blood from the wound in the anal region when she wipes.  Patient denies any shortness of breath with exertion.  Vital signs were stable today.  Patient was afebrile.  Patient does not appear toxic.  Briefly reviewed all neutropenia.  Patient's as well.  Reviewed all findings with Dr. Benay Spice; he recommended that patient return later this week for repeat labs and evaluation.

## 2017-01-31 NOTE — Assessment & Plan Note (Signed)
Patient received her first cycle of mitomycin/5FU chemotherapy on 01/22/2017.  She continues to take radiation on a daily basis.  See further notes for details of today's visit.  Today's visit was in the presence of a translator.  Patient is scheduled to return on 01/31/2017 for labs and a follow-up visit.

## 2017-01-31 NOTE — Assessment & Plan Note (Signed)
Patient presented to the [facility] today with complaint of some mild irritation underneath her upper and lower dentures; but most importantly she is complaining of some significant sore throat.  Exam today reveals some very small lesions underneath her upper and lower dentures; but her posterior oropharynx is clear with no erythema or exudate.  Patient is observed, managing all oral secretions with no difficulty.  Most likely, patient is suffering with some chemotherapy therapy-induced esophagitis.  Patient was given instructions regarding the use of Biotene and will also be prescribed Magic mouthwash without lidocaine to see if this helps.

## 2017-01-31 NOTE — Telephone Encounter (Signed)
Gave patient avs report and appointments for April. Left message for IR re picc placement for 4/16 prior to tx that day.

## 2017-01-31 NOTE — Progress Notes (Signed)
  [place] OFFICE PROGRESS NOTE   Diagnosis: Anal cancer   INTERVAL HISTORY:   She returns as scheduled. She began radiation and cycle 1 5-FU/mitomycin C 01/22/2017. She was seen in the Symptom Management Clinic earlier this week with mucositis. Her mouth and throat are feeling better. She is able to eat and drink without difficulty. She denies nausea. No significant diarrhea. She has rectal pain that mainly occurs with wiping after a bowel movement. She notes some blood on the toilet tissue after a bowel movement. She takes Aleve as needed. She does not want a stronger pain medication.  Objective:  Vital signs in last 24 hours:  Blood pressure 124/74, pulse 83, temperature 98.7 F (37.1 C), temperature source Oral, resp. rate 18, height 5\' 2"  (1.575 m), weight 120 lb (54.4 kg), SpO2 100 %.    HEENT: Linear ulceration bilateral posterior lower gumline. Patch of faint erythema at the right posterior buccal mucosa. No thrush. Lymph: Firm medial left inguinal lymph node. Resp: Lungs clear bilaterally. Cardio: Regular rate and rhythm. GI: Abdomen soft and nontender. No hepatomegaly. Rectal: Nodularity/firmness at the left anal verge. No significant perianal skin breakdown. Vascular: No leg edema.    Lab Results:  Lab Results  Component Value Date   WBC 1.3 (L) 01/31/2017   HGB 8.0 (L) 01/31/2017   HCT 25.2 (L) 01/31/2017   MCV 80.6 01/31/2017   PLT 197 01/31/2017   NEUTROABS 1.0 (L) 01/31/2017    Imaging:  No results found.  Medications: I have reviewed the patient's current medications.  Assessment/Plan: 1. Squamous cell carcinoma the anus  staging CTs 12/28/2016 confirmed an anorectal mass, inguinal/iliac adenopathy  Initiation of radiation and cycle 1 5-FU/mitomycin C on 01/22/2017 2. Rectal pain and bleeding secondary to #1 3. Mucositis secondary to chemotherapy. Improved 4. Anemia secondary to bleeding related to #1,  chemotherapy   Disposition: Ms. Francine Graven appears stable. She continues radiation. She completed cycle 1 5-FU/mitomycin C beginning 01/22/2017.   She has mild mucositis. She will continue Magic mouthwash as needed. She understands to contact the office if she is unable to maintain adequate fluid intake.  She has progressive anemia. She appears asymptomatic. We will repeat a CBC on 02/06/2017. She understands to contact the office if she develops signs/symptoms suggestive of progressive anemia. These were reviewed with her at today's visit.  She will return for labs and a follow-up visit prior to proceeding with cycle 2 5-FU/mitomycin C on 02/19/2017. We will refer her to have a PICC line placed prior to the visit that day.  She will contact the office in the interim as outlined above or with any other problems. An interpreter was present throughout today's visit.    Ned Card ANP/GNP-BC   01/31/2017  12:46 PM

## 2017-02-01 ENCOUNTER — Ambulatory Visit
Admission: RE | Admit: 2017-02-01 | Discharge: 2017-02-01 | Disposition: A | Discharge status: Home / Self Care | Payer: BLUE CROSS/BLUE SHIELD | Source: Ambulatory Visit | Attending: Radiation Oncology | Admitting: Radiation Oncology

## 2017-02-01 DIAGNOSIS — R300 Dysuria: Secondary | ICD-10-CM | POA: Diagnosis not present

## 2017-02-02 ENCOUNTER — Ambulatory Visit
Admission: RE | Admit: 2017-02-02 | Discharge: 2017-02-02 | Disposition: A | Discharge status: Home / Self Care | Payer: BLUE CROSS/BLUE SHIELD | Source: Ambulatory Visit | Attending: Radiation Oncology | Admitting: Radiation Oncology

## 2017-02-02 ENCOUNTER — Other Ambulatory Visit: Payer: Self-pay | Admitting: Radiation Oncology

## 2017-02-02 DIAGNOSIS — R300 Dysuria: Secondary | ICD-10-CM | POA: Diagnosis not present

## 2017-02-02 MED ORDER — OXYCODONE-ACETAMINOPHEN 5-325 MG PO TABS: 1.0000 | ORAL_TABLET | Freq: Four times a day (QID) | ORAL | 0 refills | Status: DC | PRN

## 2017-02-05 ENCOUNTER — Ambulatory Visit
Admission: RE | Admit: 2017-02-05 | Discharge: 2017-02-05 | Disposition: A | Discharge status: Home / Self Care | Payer: BLUE CROSS/BLUE SHIELD | Source: Ambulatory Visit | Attending: Radiation Oncology | Admitting: Radiation Oncology

## 2017-02-05 DIAGNOSIS — R300 Dysuria: Secondary | ICD-10-CM | POA: Diagnosis not present

## 2017-02-06 ENCOUNTER — Telehealth: Payer: Self-pay | Admitting: *Deleted

## 2017-02-06 ENCOUNTER — Ambulatory Visit
Admission: RE | Admit: 2017-02-06 | Discharge: 2017-02-06 | Disposition: A | Discharge status: Home / Self Care | Payer: BLUE CROSS/BLUE SHIELD | Source: Ambulatory Visit | Attending: Radiation Oncology | Admitting: Radiation Oncology

## 2017-02-06 ENCOUNTER — Other Ambulatory Visit (HOSPITAL_BASED_OUTPATIENT_CLINIC_OR_DEPARTMENT_OTHER): Payer: BLUE CROSS/BLUE SHIELD

## 2017-02-06 DIAGNOSIS — C210 Malignant neoplasm of anus, unspecified: Secondary | ICD-10-CM | POA: Diagnosis not present

## 2017-02-06 DIAGNOSIS — C774 Secondary and unspecified malignant neoplasm of inguinal and lower limb lymph nodes: Secondary | ICD-10-CM

## 2017-02-06 DIAGNOSIS — R300 Dysuria: Secondary | ICD-10-CM | POA: Diagnosis not present

## 2017-02-06 LAB — CBC WITH DIFFERENTIAL/PLATELET
BASO%: 0 % (ref 0.0–2.0)
Basophils Absolute: 0 10*3/uL (ref 0.0–0.1)
EOS ABS: 0.1 10*3/uL (ref 0.0–0.5)
EOS%: 6.3 % (ref 0.0–7.0)
HCT: 24.6 % — ABNORMAL LOW (ref 34.8–46.6)
HGB: 7.7 g/dL — ABNORMAL LOW (ref 11.6–15.9)
LYMPH%: 21.3 % (ref 14.0–49.7)
MCH: 25.4 pg (ref 25.1–34.0)
MCHC: 31.3 g/dL — AB (ref 31.5–36.0)
MCV: 81.2 fL (ref 79.5–101.0)
MONO#: 0.2 10*3/uL (ref 0.1–0.9)
MONO%: 13.4 % (ref 0.0–14.0)
NEUT%: 59 % (ref 38.4–76.8)
NEUTROS ABS: 0.8 10*3/uL — AB (ref 1.5–6.5)
PLATELETS: 44 10*3/uL — AB (ref 145–400)
RBC: 3.03 10*6/uL — AB (ref 3.70–5.45)
RDW: 13.8 % (ref 11.2–14.5)
WBC: 1.3 10*3/uL — ABNORMAL LOW (ref 3.9–10.3)
lymph#: 0.3 10*3/uL — ABNORMAL LOW (ref 0.9–3.3)
nRBC: 0 % (ref 0–0)

## 2017-02-06 NOTE — Telephone Encounter (Signed)
Message left for patient's son to inform him that Dr. Benay Spice would like for patient to come in on 02/08/17 for lab appt and to call [facility] if patient has any fevers or bleeding.  Instructed patient's son to call [place] back to confirm he received message.

## 2017-02-07 ENCOUNTER — Other Ambulatory Visit: Payer: Self-pay | Admitting: *Deleted

## 2017-02-07 ENCOUNTER — Telehealth: Payer: Self-pay | Admitting: Oncology

## 2017-02-07 ENCOUNTER — Ambulatory Visit
Admission: RE | Admit: 2017-02-07 | Discharge: 2017-02-07 | Disposition: A | Discharge status: Home / Self Care | Payer: BLUE CROSS/BLUE SHIELD | Source: Ambulatory Visit | Attending: Radiation Oncology | Admitting: Radiation Oncology

## 2017-02-07 DIAGNOSIS — C210 Malignant neoplasm of anus, unspecified: Secondary | ICD-10-CM

## 2017-02-07 DIAGNOSIS — R300 Dysuria: Secondary | ICD-10-CM | POA: Diagnosis not present

## 2017-02-07 NOTE — Telephone Encounter (Signed)
Left message re 4/5 appointments via [facility] [phone].

## 2017-02-08 ENCOUNTER — Ambulatory Visit
Admission: RE | Admit: 2017-02-08 | Discharge: 2017-02-08 | Disposition: A | Discharge status: Home / Self Care | Payer: BLUE CROSS/BLUE SHIELD | Source: Ambulatory Visit | Attending: Radiation Oncology | Admitting: Radiation Oncology

## 2017-02-08 ENCOUNTER — Other Ambulatory Visit: Payer: Self-pay | Admitting: *Deleted

## 2017-02-08 ENCOUNTER — Telehealth: Payer: Self-pay | Admitting: Oncology

## 2017-02-08 ENCOUNTER — Encounter: Payer: Self-pay | Admitting: Radiation Oncology

## 2017-02-08 ENCOUNTER — Other Ambulatory Visit (HOSPITAL_BASED_OUTPATIENT_CLINIC_OR_DEPARTMENT_OTHER): Payer: BLUE CROSS/BLUE SHIELD

## 2017-02-08 ENCOUNTER — Telehealth: Payer: Self-pay | Admitting: *Deleted

## 2017-02-08 VITALS — BP 126/83 | HR 66 | Temp 97.8°F | Resp 16 | Wt 120.8 lb

## 2017-02-08 DIAGNOSIS — D61810 Antineoplastic chemotherapy induced pancytopenia: Secondary | ICD-10-CM

## 2017-02-08 DIAGNOSIS — C210 Malignant neoplasm of anus, unspecified: Secondary | ICD-10-CM | POA: Diagnosis not present

## 2017-02-08 DIAGNOSIS — C774 Secondary and unspecified malignant neoplasm of inguinal and lower limb lymph nodes: Secondary | ICD-10-CM

## 2017-02-08 DIAGNOSIS — T451X5A Adverse effect of antineoplastic and immunosuppressive drugs, initial encounter: Secondary | ICD-10-CM

## 2017-02-08 DIAGNOSIS — R300 Dysuria: Secondary | ICD-10-CM | POA: Diagnosis not present

## 2017-02-08 LAB — CBC WITH DIFFERENTIAL/PLATELET
BASO%: 1.5 % (ref 0.0–2.0)
BASOS ABS: 0 10*3/uL (ref 0.0–0.1)
EOS%: 4.5 % (ref 0.0–7.0)
Eosinophils Absolute: 0 10*3/uL (ref 0.0–0.5)
HCT: 25.2 % — ABNORMAL LOW (ref 34.8–46.6)
HGB: 7.9 g/dL — ABNORMAL LOW (ref 11.6–15.9)
LYMPH%: 29.9 % (ref 14.0–49.7)
MCH: 25.6 pg (ref 25.1–34.0)
MCHC: 31.3 g/dL — AB (ref 31.5–36.0)
MCV: 81.8 fL (ref 79.5–101.0)
MONO#: 0.2 10*3/uL (ref 0.1–0.9)
MONO%: 28.4 % — AB (ref 0.0–14.0)
NEUT#: 0.2 10*3/uL — CL (ref 1.5–6.5)
NEUT%: 35.7 % — AB (ref 38.4–76.8)
Platelets: 39 10*3/uL — ABNORMAL LOW (ref 145–400)
RBC: 3.08 10*6/uL — AB (ref 3.70–5.45)
RDW: 14 % (ref 11.2–14.5)
WBC: 0.7 10*3/uL — CL (ref 3.9–10.3)
lymph#: 0.2 10*3/uL — ABNORMAL LOW (ref 0.9–3.3)

## 2017-02-08 MED ORDER — CIPROFLOXACIN HCL 500 MG PO TABS: 500.0000 mg | ORAL_TABLET | Freq: Two times a day (BID) | ORAL | 0 refills | Status: DC

## 2017-02-08 NOTE — Progress Notes (Signed)
  Radiation Oncology         [phone] ________________________________  Name: Anna Lawrence MRN: [id number]  Date: 02/08/2017  DOB: 12-16-1950  Weekly Radiation Therapy Management    ICD-9-CM ICD-10-CM   1. Anal cancer (HCC) 154.3 C21.0      Current Dose: 25.2 Gy     Planned Dose:  54 Gy  Narrative . . . . . . . . The patient presents for routine under treatment assessment. Pt endorses fatigue, citing a lack of sleep in addition to effects of RT. Pt continues to endorse a small amount of bright red blood on tissue when wiping after bowel movement. She endorses looser stools, but states she has found relief by replacing meats with vegetables in her diet. She endorses a good appetite. Pt reports of an upset stomach 3 x yesterday after eating. She denies nausea or any other pain. She is using baby wipes after bowel movements.   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                          The patient is otherwise without complaint.                                 Set-up films were reviewed.                                 The chart was checked. Physical Findings. . .  weight is 120 lb 12.8 oz (54.8 kg). Her oral temperature is 97.8 F (36.6 C). Her blood pressure is 126/83 and her pulse is 66. Her respiration is 16. . Weight essentially stable.  No significant changes. Lungs are clear to auscultation bilaterally. Heart has regular rate and rhythm. Abdomen soft, non-tender, normal bowel sounds.  Impression . . . . . . . The patient is tolerating radiation. Plan . . . . . . . . . . . . Treatment pending STAT labs prior to treatment tomorrow given recent labs showing neutropenia and thrombocytopenia. Medical oncology is placing pt on ciprofloxacin.   ________________________________   Blair Promise, PhD, MD  This document serves as a record of services personally performed by Gery Pray, MD. It was created on his behalf by Linward Natal, a  trained medical scribe. The creation of this record is based on the scribe's personal observations and the provider's statements to them. This document has been checked and approved by the attending provider.

## 2017-02-08 NOTE — Telephone Encounter (Signed)
Added [place] 4/6 per 4/5 schedule message. Per desk nurse she will give patient appointment.

## 2017-02-08 NOTE — Progress Notes (Addendum)
Weekly rad txs anal 14/30 completed,  Still has a small amount blood when wipes on tissue after bowel movements, no diarrhea, appetite good,   Had upset stomach 3 x yesterday after eating, no nausea, using baby wipes after bowel movements, rubs hard still, encouraged to pat dry, interpreter in [place],  No pain BP 126/83 (BP Location: Left Arm, Patient Position: Sitting, Cuff Size: Normal)   Pulse 66   Temp 97.8 F (36.6 C) (Oral)   Resp 16   Wt 120 lb 12.8 oz (54.8 kg)   BMI 22.09 kg/m   Wt Readings from Last 3 Encounters:  02/08/17 120 lb 12.8 oz (54.8 kg)  01/31/17 120 lb (54.4 kg)  01/29/17 119 lb 14.4 oz (54.4 kg)

## 2017-02-08 NOTE — Progress Notes (Signed)
Call from [place],  rx for cipro called to [facility] pharmacy, patient  Was getting in her car,stopped her and gave her information to go to [facility] for Cipro 500mg  po bid xz 7 days, call for fever or bleding,  Labs ordered by Shona Simpson, PA tomorrow 930 am stat cbc with dif, informed patient also, patient left, , left [place] vm 11:55 AM

## 2017-02-08 NOTE — Telephone Encounter (Addendum)
Critical WBC and ANC reviewed by Dr. Benay Spice: Order received for Cipro 500 mg BID for 7 days. [place] visit on 02/09/17. Call office for fever or bleeding. Unable to reach pt or son by phone. Coordinated with Thayer Headings, RN in [place]. Per Thayer Headings, pt instructed to pick up antibiotic from [facility].

## 2017-02-09 ENCOUNTER — Ambulatory Visit (HOSPITAL_BASED_OUTPATIENT_CLINIC_OR_DEPARTMENT_OTHER): Payer: BLUE CROSS/BLUE SHIELD | Admitting: Nurse Practitioner

## 2017-02-09 ENCOUNTER — Ambulatory Visit: Admission: RE | Admit: 2017-02-09 | Payer: BLUE CROSS/BLUE SHIELD | Source: Ambulatory Visit

## 2017-02-09 ENCOUNTER — Encounter: Payer: Self-pay | Admitting: Radiation Oncology

## 2017-02-09 ENCOUNTER — Other Ambulatory Visit (HOSPITAL_BASED_OUTPATIENT_CLINIC_OR_DEPARTMENT_OTHER): Payer: BLUE CROSS/BLUE SHIELD

## 2017-02-09 ENCOUNTER — Encounter: Payer: Self-pay | Admitting: Nurse Practitioner

## 2017-02-09 ENCOUNTER — Other Ambulatory Visit: Payer: Self-pay | Admitting: Radiation Oncology

## 2017-02-09 VITALS — BP 131/76 | HR 77 | Temp 97.5°F | Resp 18 | Ht 62.0 in | Wt 119.8 lb

## 2017-02-09 DIAGNOSIS — C210 Malignant neoplasm of anus, unspecified: Secondary | ICD-10-CM

## 2017-02-09 DIAGNOSIS — D61810 Antineoplastic chemotherapy induced pancytopenia: Secondary | ICD-10-CM

## 2017-02-09 DIAGNOSIS — T451X5A Adverse effect of antineoplastic and immunosuppressive drugs, initial encounter: Secondary | ICD-10-CM

## 2017-02-09 DIAGNOSIS — K209 Esophagitis, unspecified without bleeding: Secondary | ICD-10-CM

## 2017-02-09 LAB — CBC WITH DIFFERENTIAL/PLATELET
BASO%: 1 % (ref 0.0–2.0)
BASOS ABS: 0 10*3/uL (ref 0.0–0.1)
EOS%: 1.6 % (ref 0.0–7.0)
Eosinophils Absolute: 0 10*3/uL (ref 0.0–0.5)
HCT: 25.5 % — ABNORMAL LOW (ref 34.8–46.6)
HEMOGLOBIN: 8 g/dL — AB (ref 11.6–15.9)
LYMPH%: 22.5 % (ref 14.0–49.7)
MCH: 25.5 pg (ref 25.1–34.0)
MCHC: 31.4 g/dL — ABNORMAL LOW (ref 31.5–36.0)
MCV: 81.2 fL (ref 79.5–101.0)
MONO#: 0.2 10*3/uL (ref 0.1–0.9)
MONO%: 33.6 % — AB (ref 0.0–14.0)
NEUT%: 41.3 % (ref 38.4–76.8)
NEUTROS ABS: 0.3 10*3/uL — AB (ref 1.5–6.5)
Platelets: 51 10*3/uL — ABNORMAL LOW (ref 145–400)
RBC: 3.14 10*6/uL — ABNORMAL LOW (ref 3.70–5.45)
RDW: 14.9 % — AB (ref 11.2–14.5)
WBC: 0.6 10*3/uL — AB (ref 3.9–10.3)
lymph#: 0.1 10*3/uL — ABNORMAL LOW (ref 0.9–3.3)

## 2017-02-09 NOTE — Assessment & Plan Note (Signed)
Patient is happy to report that her mucositis/esophagitis has him as completely resolved at this point.  Will continue to monitor.

## 2017-02-09 NOTE — Progress Notes (Signed)
SYMPTOM MANAGEMENT CLINIC    Chief Complaint: Pancytopenia  HPI:  Anna Lawrence 66 y.o. female diagnosed with anal cancer.  Currently undergoing mitomycin/5FU chemotherapy regimen.    No history exists.    Review of Systems  Constitutional: Positive for malaise/fatigue.  HENT: Positive for sore throat.   All other systems reviewed and are negative.   Past Medical History:  Diagnosis Date  . Chicken pox   . Colon cancer ([place]) 12/25/2016   rectal/anus    Past Surgical History:  Procedure Laterality Date  . IR GENERIC HISTORICAL  01/22/2017   IR US GUIDE VASC ACCESS RIGHT 01/22/2017 Corrie Mckusick, DO [facility]  . IR GENERIC HISTORICAL  01/22/2017   IR FLUORO GUIDE CV LINE RIGHT 01/22/2017 Corrie Mckusick, DO [facility]  . TUBAL LIGATION      has Special screening for malignant neoplasms, colon; Pelvic mass in female; Hearing loss in right ear; Visit for preventive health examination; Breast cancer screening; Internal hemorrhoids; Anal cancer ([place]); Port catheter in place; Esophagitis; and Pancytopenia due to antineoplastic chemotherapy [facility]) on her problem list.    has No Known Allergies.  Allergies as of 02/09/2017   No Known Allergies     Medication List       Accurate as of 02/09/17  6:33 PM. Always use your most recent med list.          ciprofloxacin 500 MG tablet Commonly known as:  CIPRO Take 1 tablet (500 mg total) by mouth 2 (two) times daily.   FISH OIL PO Take by mouth. Take 1 capsule by mouth 2 times daily.   magic mouthwash Soln Take 5 mLs by mouth 4 (four) times daily as needed for mouth pain.   naproxen sodium 220 MG tablet Commonly known as:  ANAPROX Take 220 mg by mouth as needed.   oxyCODONE-acetaminophen 5-325 MG tablet Commonly known as:  PERCOCET/ROXICET Take 1-2 tablets by mouth every 6 (six) hours as needed for severe pain.   polyethylene glycol powder powder Commonly known as:  GLYCOLAX/MIRALAX Take 34 g by mouth 2 (two)  times daily.   prochlorperazine 10 MG tablet Commonly known as:  COMPAZINE Take 1 tablet (10 mg total) by mouth every 6 (six) hours as needed for nausea or vomiting.        PHYSICAL EXAMINATION  Oncology Vitals 02/09/2017 02/08/2017  Height 158 cm -  Weight 54.341 kg 54.795 kg  Weight (lbs) 119 lbs 13 oz 120 lbs 13 oz  BMI (kg/m2) 21.91 kg/m2 22.09 kg/m2  Temp 97.5 97.8  Pulse 77 66  Resp 18 16  SpO2 100 -  BSA (m2) 1.54 m2 1.55 m2   BP Readings from Last 2 Encounters:  02/09/17 131/76  02/08/17 126/83    Physical Exam  Constitutional: She is oriented to person, place, and time. Vital signs are normal. She appears unhealthy.  HENT:  Head: Normocephalic and atraumatic.  Mouth/Throat: Oropharynx is clear and moist.  No obvious oral lesions in mouth today.  Patient managing all oral secretions well.  Eyes: Conjunctivae and EOM are normal. Pupils are equal, round, and reactive to light. Right eye exhibits no discharge. Left eye exhibits no discharge. No scleral icterus.  Neck: Normal range of motion. Neck supple. No JVD present. No tracheal deviation present. No thyromegaly present.  Cardiovascular: Normal rate, regular rhythm, normal heart sounds and intact distal pulses.   Pulmonary/Chest: Effort normal and breath sounds normal. No respiratory distress. She has no wheezes. She has no rales.  She exhibits no tenderness.  Abdominal: Soft. Bowel sounds are normal. She exhibits no distension and no mass. There is no tenderness. There is no rebound and no guarding.  Musculoskeletal: Normal range of motion. She exhibits no edema or tenderness.  Lymphadenopathy:    She has no cervical adenopathy.  Neurological: She is alert and oriented to person, place, and time. Gait normal.  Skin: Skin is warm and dry. No rash noted. No erythema. No pallor.  There were no masses noted to the perineal area including the rectum.  On exam today.  There was an approximate dime-sized healing lesion only  near the rectum.  No evidence of infection.  Psychiatric: Affect normal.  Nursing note and vitals reviewed.   LABORATORY DATA:. Appointment on 02/09/2017  Component Date Value Ref Range Status  . WBC 02/09/2017 0.6* 3.9 - 10.3 10e3/uL Final  . NEUT# 02/09/2017 0.3* 1.5 - 6.5 10e3/uL Final  . HGB 02/09/2017 8.0* 11.6 - 15.9 g/dL Final  . HCT 02/09/2017 25.5* 34.8 - 46.6 % Final  . Platelets 02/09/2017 51* 145 - 400 10e3/uL Final  . MCV 02/09/2017 81.2  79.5 - 101.0 fL Final  . MCH 02/09/2017 25.5  25.1 - 34.0 pg Final  . MCHC 02/09/2017 31.4* 31.5 - 36.0 g/dL Final  . RBC 02/09/2017 3.14* 3.70 - 5.45 10e6/uL Final  . RDW 02/09/2017 14.9* 11.2 - 14.5 % Final  . lymph# 02/09/2017 0.1* 0.9 - 3.3 10e3/uL Final  . MONO# 02/09/2017 0.2  0.1 - 0.9 10e3/uL Final  . Eosinophils Absolute 02/09/2017 0.0  0.0 - 0.5 10e3/uL Final  . Basophils Absolute 02/09/2017 0.0  0.0 - 0.1 10e3/uL Final  . NEUT% 02/09/2017 41.3  38.4 - 76.8 % Final  . LYMPH% 02/09/2017 22.5  14.0 - 49.7 % Final  . MONO% 02/09/2017 33.6* 0.0 - 14.0 % Final  . EOS% 02/09/2017 1.6  0.0 - 7.0 % Final  . BASO% 02/09/2017 1.0  0.0 - 2.0 % Final  Appointment on 02/08/2017  Component Date Value Ref Range Status  . WBC 02/08/2017 0.7* 3.9 - 10.3 10e3/uL Final  . NEUT# 02/08/2017 0.2* 1.5 - 6.5 10e3/uL Final  . HGB 02/08/2017 7.9* 11.6 - 15.9 g/dL Final  . HCT 02/08/2017 25.2* 34.8 - 46.6 % Final  . Platelets 02/08/2017 39* 145 - 400 10e3/uL Final  . MCV 02/08/2017 81.8  79.5 - 101.0 fL Final  . MCH 02/08/2017 25.6  25.1 - 34.0 pg Final  . MCHC 02/08/2017 31.3* 31.5 - 36.0 g/dL Final  . RBC 02/08/2017 3.08* 3.70 - 5.45 10e6/uL Final  . RDW 02/08/2017 14.0  11.2 - 14.5 % Final  . lymph# 02/08/2017 0.2* 0.9 - 3.3 10e3/uL Final  . MONO# 02/08/2017 0.2  0.1 - 0.9 10e3/uL Final  . Eosinophils Absolute 02/08/2017 0.0  0.0 - 0.5 10e3/uL Final  . Basophils Absolute 02/08/2017 0.0  0.0 - 0.1 10e3/uL Final  . NEUT% 02/08/2017 35.7* 38.4  - 76.8 % Final  . LYMPH% 02/08/2017 29.9  14.0 - 49.7 % Final  . MONO% 02/08/2017 28.4* 0.0 - 14.0 % Final  . EOS% 02/08/2017 4.5  0.0 - 7.0 % Final  . BASO% 02/08/2017 1.5  0.0 - 2.0 % Final    RADIOGRAPHIC STUDIES: No results found.  ASSESSMENT/PLAN:    Pancytopenia due to antineoplastic chemotherapy [facility]) Patient last received chemotherapy on 01/22/2017.  Blood counts obtained today revealed WBC 0.6, ANC 0.3, hemoglobin 8.0, platelet count 51.  All the blood counts are slowly improving;  but patient was prescribed Cipro antibiotics by Dr. Benay Spice just yesterday prophylactically.  However, patient states that she did not pick up her antibiotics last night; plans to pick them up and start taking them today.  She continues to deny any recent fevers or chills.  On exam today.  Patient appears well and nontoxic.  Patient was strongly encouraged via her interpreter-to start taking antibiotics as directed.  She was also encouraged to go directly to the emergency room at of the weekend for any worsening symptoms whatsoever.  Esophagitis Patient is happy to report that her mucositis/esophagitis has him as completely resolved at this point.  Will continue to monitor.  Anal cancer [facility]) Patient received her last mitomycin / 5FU chemotherapy on 01/22/2017.  She is scheduled to return on 02/19/2017 for labs, flush, visit, and her next cycle of chemotherapy.   Patient stated understanding of all instructions; and was in agreement with this plan of care. The patient knows to call the clinic with any problems, questions or concerns.   Total time spent with patient was 25 minutes;  with greater than 75 percent of that time spent in face to face counseling regarding patient's symptoms,  and coordination of care and follow up.  Disclaimer:This dictation was prepared with Dragon/digital dictation along with [employer]. Any transcriptional errors that result from this process are  unintentional.  Drue Second, NP 02/09/2017

## 2017-02-09 NOTE — Assessment & Plan Note (Signed)
Patient last received chemotherapy on 01/22/2017.  Blood counts obtained today revealed WBC 0.6, ANC 0.3, hemoglobin 8.0, platelet count 51.  All the blood counts are slowly improving; but patient was prescribed Cipro antibiotics by Dr. Benay Spice just yesterday prophylactically.  However, patient states that she did not pick up her antibiotics last night; plans to pick them up and start taking them today.  She continues to deny any recent fevers or chills.  On exam today.  Patient appears well and nontoxic.  Patient was strongly encouraged via her interpreter-to start taking antibiotics as directed.  She was also encouraged to go directly to the emergency room at of the weekend for any worsening symptoms whatsoever.

## 2017-02-09 NOTE — Assessment & Plan Note (Signed)
Patient received her last mitomycin / 5FU chemotherapy on 01/22/2017.  She is scheduled to return on 02/19/2017 for labs, flush, visit, and her next cycle of chemotherapy.

## 2017-02-12 ENCOUNTER — Ambulatory Visit
Admission: RE | Admit: 2017-02-12 | Discharge: 2017-02-12 | Disposition: A | Discharge status: Home / Self Care | Payer: BLUE CROSS/BLUE SHIELD | Source: Ambulatory Visit | Attending: Radiation Oncology | Admitting: Radiation Oncology

## 2017-02-12 ENCOUNTER — Other Ambulatory Visit: Payer: Self-pay | Admitting: *Deleted

## 2017-02-12 ENCOUNTER — Encounter: Payer: Self-pay | Admitting: *Deleted

## 2017-02-12 DIAGNOSIS — R300 Dysuria: Secondary | ICD-10-CM | POA: Diagnosis not present

## 2017-02-12 DIAGNOSIS — C210 Malignant neoplasm of anus, unspecified: Secondary | ICD-10-CM

## 2017-02-12 LAB — CBC WITH DIFFERENTIAL/PLATELET
BASO%: 0.7 % (ref 0.0–2.0)
Basophils Absolute: 0 10*3/uL (ref 0.0–0.1)
EOS%: 4 % (ref 0.0–7.0)
Eosinophils Absolute: 0 10*3/uL (ref 0.0–0.5)
HEMATOCRIT: 27.1 % — AB (ref 34.8–46.6)
HEMOGLOBIN: 8.6 g/dL — AB (ref 11.6–15.9)
LYMPH#: 0.2 10*3/uL — AB (ref 0.9–3.3)
LYMPH%: 13.1 % — AB (ref 14.0–49.7)
MCH: 25.9 pg (ref 25.1–34.0)
MCHC: 31.8 g/dL (ref 31.5–36.0)
MCV: 81.5 fL (ref 79.5–101.0)
MONO#: 0.3 10*3/uL (ref 0.1–0.9)
MONO%: 20.2 % — ABNORMAL HIGH (ref 0.0–14.0)
NEUT%: 62 % (ref 38.4–76.8)
NEUTROS ABS: 0.8 10*3/uL — AB (ref 1.5–6.5)
PLATELETS: 110 10*3/uL — AB (ref 145–400)
RBC: 3.33 10*6/uL — AB (ref 3.70–5.45)
RDW: 14.7 % — ABNORMAL HIGH (ref 11.2–14.5)
WBC: 1.2 10*3/uL — AB (ref 3.9–10.3)

## 2017-02-12 NOTE — Progress Notes (Signed)
Showed cbc with diff resuslts to Shona Simpson, PAC=OK to have radiation treatment", Interpreter and patient escorted to [facility] for treatment,  Wbc=1.2, neut#=0.8 hgb=8.6, [lts=110,  11:10 AM

## 2017-02-13 ENCOUNTER — Ambulatory Visit
Admission: RE | Admit: 2017-02-13 | Discharge: 2017-02-13 | Disposition: A | Discharge status: Home / Self Care | Payer: BLUE CROSS/BLUE SHIELD | Source: Ambulatory Visit | Attending: Radiation Oncology | Admitting: Radiation Oncology

## 2017-02-13 DIAGNOSIS — R300 Dysuria: Secondary | ICD-10-CM | POA: Diagnosis not present

## 2017-02-14 ENCOUNTER — Ambulatory Visit
Admission: RE | Admit: 2017-02-14 | Discharge: 2017-02-14 | Disposition: A | Discharge status: Home / Self Care | Payer: BLUE CROSS/BLUE SHIELD | Source: Ambulatory Visit | Attending: Radiation Oncology | Admitting: Radiation Oncology

## 2017-02-14 ENCOUNTER — Telehealth: Payer: Self-pay | Admitting: Oncology

## 2017-02-14 DIAGNOSIS — R300 Dysuria: Secondary | ICD-10-CM | POA: Diagnosis not present

## 2017-02-14 NOTE — Telephone Encounter (Signed)
Appointment scheduled with Ernestene Kiel on 04/24, per 04/11 schd msg.

## 2017-02-15 ENCOUNTER — Ambulatory Visit
Admission: RE | Admit: 2017-02-15 | Discharge: 2017-02-15 | Disposition: A | Discharge status: Home / Self Care | Payer: BLUE CROSS/BLUE SHIELD | Source: Ambulatory Visit | Attending: Radiation Oncology | Admitting: Radiation Oncology

## 2017-02-15 DIAGNOSIS — R300 Dysuria: Secondary | ICD-10-CM | POA: Diagnosis not present

## 2017-02-16 ENCOUNTER — Ambulatory Visit
Admission: RE | Admit: 2017-02-16 | Discharge: 2017-02-16 | Disposition: A | Discharge status: Home / Self Care | Payer: BLUE CROSS/BLUE SHIELD | Source: Ambulatory Visit | Attending: Radiation Oncology | Admitting: Radiation Oncology

## 2017-02-16 DIAGNOSIS — R300 Dysuria: Secondary | ICD-10-CM | POA: Diagnosis not present

## 2017-02-19 ENCOUNTER — Ambulatory Visit (HOSPITAL_COMMUNITY)
Admission: RE | Admit: 2017-02-19 | Discharge: 2017-02-19 | Disposition: A | Discharge status: Home / Self Care | Payer: BLUE CROSS/BLUE SHIELD | Source: Ambulatory Visit | Attending: Nurse Practitioner | Admitting: Nurse Practitioner

## 2017-02-19 ENCOUNTER — Other Ambulatory Visit (HOSPITAL_BASED_OUTPATIENT_CLINIC_OR_DEPARTMENT_OTHER): Payer: BLUE CROSS/BLUE SHIELD

## 2017-02-19 ENCOUNTER — Encounter: Payer: Self-pay | Admitting: *Deleted

## 2017-02-19 ENCOUNTER — Ambulatory Visit
Admission: RE | Admit: 2017-02-19 | Discharge: 2017-02-19 | Disposition: A | Discharge status: Home / Self Care | Payer: BLUE CROSS/BLUE SHIELD | Source: Ambulatory Visit | Attending: Radiation Oncology | Admitting: Radiation Oncology

## 2017-02-19 ENCOUNTER — Other Ambulatory Visit: Payer: Self-pay | Admitting: Nurse Practitioner

## 2017-02-19 ENCOUNTER — Ambulatory Visit (HOSPITAL_BASED_OUTPATIENT_CLINIC_OR_DEPARTMENT_OTHER): Payer: BLUE CROSS/BLUE SHIELD

## 2017-02-19 ENCOUNTER — Encounter (HOSPITAL_COMMUNITY): Payer: Self-pay | Admitting: Student

## 2017-02-19 ENCOUNTER — Ambulatory Visit: Payer: BLUE CROSS/BLUE SHIELD | Admitting: Nurse Practitioner

## 2017-02-19 ENCOUNTER — Ambulatory Visit (HOSPITAL_BASED_OUTPATIENT_CLINIC_OR_DEPARTMENT_OTHER): Payer: BLUE CROSS/BLUE SHIELD | Admitting: Nurse Practitioner

## 2017-02-19 VITALS — BP 128/80 | HR 81 | Temp 98.3°F | Resp 16 | Wt 121.0 lb

## 2017-02-19 DIAGNOSIS — Z5111 Encounter for antineoplastic chemotherapy: Secondary | ICD-10-CM | POA: Diagnosis not present

## 2017-02-19 DIAGNOSIS — I878 Other specified disorders of veins: Secondary | ICD-10-CM | POA: Insufficient documentation

## 2017-02-19 DIAGNOSIS — G893 Neoplasm related pain (acute) (chronic): Secondary | ICD-10-CM

## 2017-02-19 DIAGNOSIS — D6481 Anemia due to antineoplastic chemotherapy: Secondary | ICD-10-CM

## 2017-02-19 DIAGNOSIS — C210 Malignant neoplasm of anus, unspecified: Secondary | ICD-10-CM

## 2017-02-19 DIAGNOSIS — L089 Local infection of the skin and subcutaneous tissue, unspecified: Secondary | ICD-10-CM | POA: Diagnosis not present

## 2017-02-19 DIAGNOSIS — R300 Dysuria: Secondary | ICD-10-CM | POA: Diagnosis not present

## 2017-02-19 HISTORY — PX: IR US GUIDE VASC ACCESS RIGHT: IMG2390

## 2017-02-19 HISTORY — PX: IR FLUORO GUIDE CV LINE RIGHT: IMG2283

## 2017-02-19 LAB — CBC WITH DIFFERENTIAL/PLATELET
BASO%: 0.5 % (ref 0.0–2.0)
BASOS ABS: 0 10*3/uL (ref 0.0–0.1)
EOS ABS: 0.2 10*3/uL (ref 0.0–0.5)
EOS%: 5.2 % (ref 0.0–7.0)
HCT: 26.2 % — ABNORMAL LOW (ref 34.8–46.6)
HGB: 8.5 g/dL — ABNORMAL LOW (ref 11.6–15.9)
LYMPH#: 0.1 10*3/uL — AB (ref 0.9–3.3)
LYMPH%: 3.3 % — ABNORMAL LOW (ref 14.0–49.7)
MCH: 26.4 pg (ref 25.1–34.0)
MCHC: 32.5 g/dL (ref 31.5–36.0)
MCV: 81.3 fL (ref 79.5–101.0)
MONO#: 0.2 10*3/uL (ref 0.1–0.9)
MONO%: 4.8 % (ref 0.0–14.0)
NEUT%: 86.2 % — ABNORMAL HIGH (ref 38.4–76.8)
NEUTROS ABS: 3.4 10*3/uL (ref 1.5–6.5)
Platelets: 265 10*3/uL (ref 145–400)
RBC: 3.23 10*6/uL — ABNORMAL LOW (ref 3.70–5.45)
RDW: 16 % — AB (ref 11.2–14.5)
WBC: 4 10*3/uL (ref 3.9–10.3)

## 2017-02-19 LAB — COMPREHENSIVE METABOLIC PANEL
ALBUMIN: 3.5 g/dL (ref 3.5–5.0)
ALT: 11 U/L (ref 0–55)
AST: 18 U/L (ref 5–34)
Alkaline Phosphatase: 60 U/L (ref 40–150)
Anion Gap: 10 mEq/L (ref 3–11)
BUN: 14.7 mg/dL (ref 7.0–26.0)
CHLORIDE: 106 meq/L (ref 98–109)
CO2: 26 mEq/L (ref 22–29)
Calcium: 9.4 mg/dL (ref 8.4–10.4)
Creatinine: 0.9 mg/dL (ref 0.6–1.1)
EGFR: 71 mL/min/{1.73_m2} — ABNORMAL LOW (ref >90)
GLUCOSE: 110 mg/dL (ref 70–140)
POTASSIUM: 4 meq/L (ref 3.5–5.1)
SODIUM: 142 meq/L (ref 136–145)
Total Bilirubin: 0.24 mg/dL (ref 0.20–1.20)
Total Protein: 7.3 g/dL (ref 6.4–8.3)

## 2017-02-19 MED ORDER — LIDOCAINE HCL 1 % IJ SOLN
INTRAMUSCULAR | Status: DC | PRN
  Administered 2017-02-19: 10 mL

## 2017-02-19 MED ORDER — SODIUM CHLORIDE 0.9 % IV SOLN
Freq: Once | INTRAVENOUS | Status: AC
  Administered 2017-02-19: 13:00:00 via INTRAVENOUS

## 2017-02-19 MED ORDER — HEPARIN SOD (PORK) LOCK FLUSH 100 UNIT/ML IV SOLN
250.0000 [IU] | Freq: Once | INTRAVENOUS | Status: AC
  Administered 2017-02-19: 250 [IU] via INTRAVENOUS
  Filled 2017-02-19: qty 5

## 2017-02-19 MED ORDER — MITOMYCIN CHEMO IV INJECTION 20 MG: 10.0000 mg/m2 | Freq: Once | INTRAVENOUS | Status: DC

## 2017-02-19 MED ORDER — IOPAMIDOL (ISOVUE-300) INJECTION 61%
50.0000 mL | Freq: Once | INTRAVENOUS | Status: AC | PRN
  Administered 2017-02-19: 3 mL via INTRAVENOUS

## 2017-02-19 MED ORDER — PROCHLORPERAZINE MALEATE 10 MG PO TABS
ORAL_TABLET | ORAL | Status: AC
  Filled 2017-02-19: qty 1

## 2017-02-19 MED ORDER — PROCHLORPERAZINE MALEATE 10 MG PO TABS
10.0000 mg | ORAL_TABLET | Freq: Once | ORAL | Status: AC
  Administered 2017-02-19: 10 mg via ORAL

## 2017-02-19 MED ORDER — SODIUM CHLORIDE 0.9% FLUSH
10.0000 mL | Freq: Once | INTRAVENOUS | Status: AC
  Administered 2017-02-19: 10 mL via INTRAVENOUS
  Filled 2017-02-19: qty 10

## 2017-02-19 MED ORDER — IOPAMIDOL (ISOVUE-300) INJECTION 61%
INTRAVENOUS | Status: AC
  Filled 2017-02-19: qty 50

## 2017-02-19 MED ORDER — MITOMYCIN CHEMO IV INJECTION 20 MG
4.0000 mg/m2 | Freq: Once | INTRAVENOUS | Status: AC
Start: 2017-02-19 — End: 2017-02-19
  Administered 2017-02-19: 6.5 mg via INTRAVENOUS
  Filled 2017-02-19: qty 13

## 2017-02-19 MED ORDER — SODIUM CHLORIDE 0.9 % IV SOLN
750.0000 mg/m2/d | INTRAVENOUS | Status: DC
  Administered 2017-02-19: 4700 mg via INTRAVENOUS
  Filled 2017-02-19: qty 94

## 2017-02-19 MED ORDER — LIDOCAINE HCL 1 % IJ SOLN
INTRAMUSCULAR | Status: AC
  Filled 2017-02-19: qty 20

## 2017-02-19 MED ORDER — DOXYCYCLINE HYCLATE 100 MG PO TABS: 100.0000 mg | ORAL_TABLET | Freq: Two times a day (BID) | ORAL | 0 refills | Status: DC

## 2017-02-19 MED ORDER — HEPARIN SOD (PORK) LOCK FLUSH 100 UNIT/ML IV SOLN
INTRAVENOUS | Status: AC
  Filled 2017-02-19: qty 5

## 2017-02-19 NOTE — Progress Notes (Signed)
Received phone call from Clarise Cruz, [place] for Ned Card, NP.  She was concerned about the ulceration that the patient presented with at her follow up appointment today.  She said that the skin from the perineum to the labia was red and ulcerated and wanted to know if the patient could do anything to assist with the irritation in this area.    Per Dr Lisbeth Renshaw he recommended sitz bath for the patient and application of A&D onitment to the area.  He will see her earlier this week on Wednesday for weekly under treat to assess the perineum area for any improvement.    Brought sitz bath equipment up to patient in the infusion area and relayed information with assistance of interpretor.

## 2017-02-19 NOTE — Progress Notes (Signed)
RN visit for lab draw from PICC line

## 2017-02-19 NOTE — Patient Instructions (Signed)
[place] Discharge Instructions for Patients Receiving Chemotherapy  Today you received the following chemotherapy agents: Mitomycin and Adrucil.  To help prevent nausea and vomiting after your treatment, we encourage you to take your nausea medication as directed.   If you develop nausea and vomiting that is not controlled by your nausea medication, call the clinic.   BELOW ARE SYMPTOMS THAT SHOULD BE REPORTED IMMEDIATELY:  *FEVER GREATER THAN 100.5 F  *CHILLS WITH OR WITHOUT FEVER  NAUSEA AND VOMITING THAT IS NOT CONTROLLED WITH YOUR NAUSEA MEDICATION  *UNUSUAL SHORTNESS OF BREATH  *UNUSUAL BRUISING OR BLEEDING  TENDERNESS IN MOUTH AND THROAT WITH OR WITHOUT PRESENCE OF ULCERS  *URINARY PROBLEMS  *BOWEL PROBLEMS  UNUSUAL RASH Items with * indicate a potential emergency and should be followed up as soon as possible.  Feel free to call the clinic you have any questions or concerns. The clinic phone number is [phone].  Please show the [place] at check-in to the Emergency Department and triage nurse.

## 2017-02-19 NOTE — Progress Notes (Addendum)
[place] OFFICE PROGRESS NOTE   Diagnosis:  Anal cancer  INTERVAL HISTORY:   She returns as scheduled. She completed cycle 1 5-FU/mitomycin C beginning 01/22/2017. She continues radiation. She has mild nausea. No vomiting. No recent diarrhea. Mouth sores have resolved. Oral intake described as "okay". She reports significant pain at the rectum. She takes oxycodone occasionally. The pain medicine helps but makes her sleepy.  Objective:  Vital signs in last 24 hours:  Blood pressure 128/80, pulse 81, temperature 98.3 F (36.8 C), temperature source Oral, resp. rate 16, weight 121 lb (54.9 kg), SpO2 100 %.    HEENT: No thrush or ulcers. Lymphatics: Palpable firm medial left inguinal lymph node. Resp: Lungs clear bilaterally. Cardio: Regular rate and rhythm. GI: Abdomen soft and nontender. No hepatomegaly. Vascular: No leg edema. Skin: Left pubic region with erythema and 3 tender pustular indurated skin lesions. Similar lesion at the left buttock. Ulceration at the perineum/labia.  Right upper showed the PICC without erythema.  Lab Results:  Lab Results  Component Value Date   WBC 4.0 02/19/2017   HGB 8.5 (L) 02/19/2017   HCT 26.2 (L) 02/19/2017   MCV 81.3 02/19/2017   PLT 265 02/19/2017   NEUTROABS 3.4 02/19/2017    Imaging:  Ir Fluoro Guide Cv Line Right  Result Date: 02/19/2017 INDICATION: Patient with poor venous access. Request is made for peripherally inserted central catheter. EXAM: ULTRASOUND AND FLUOROSCOPIC GUIDED PICC LINE INSERTION MEDICATIONS: 3 mL 1% lidocaine CONTRAST:  3 mL FLUOROSCOPY TIME:  60 seconds COMPLICATIONS: None immediate. TECHNIQUE: The procedure, risks, benefits, and alternatives were explained to the patient and informed written consent was obtained. A timeout was performed prior to the initiation of the procedure. The right upper extremity was prepped with chlorhexidine in a sterile fashion, and a sterile drape was applied  covering the operative field. Maximum barrier sterile technique with sterile gowns and gloves were used for the procedure. A timeout was performed prior to the initiation of the procedure. Local anesthesia was provided with 1% lidocaine. Under direct ultrasound guidance, the right basilic vein was accessed with a micropuncture kit after the overlying soft tissues were anesthetized with 1% lidocaine. An ultrasound image was saved for documentation purposes. A guidewire was advanced to the level of the superior caval-atrial junction for measurement purposes and the PICC line was cut to length. Catheter could not be advanced initially, so 3 mL of Omnipaque were injected and venography performed which reveal catheter in good position. A peel-away sheath was placed and a 39 cm, 5 [place], dual lumen was inserted to level of the superior caval-atrial junction. A post procedure spot fluoroscopic was obtained. The catheter easily aspirated and flushed and was secured in place. A dressing was placed. The patient tolerated the procedure well without immediate post procedural complication. FINDINGS: After catheter placement, the tip lies within the superior cavoatrial junction. The catheter aspirates and flushes normally and is ready for immediate use. IMPRESSION: Successful ultrasound and fluoroscopic guided placement of a right basilic vein approach, 39 cm, 5 French, dual lumen PICC with tip at the superior caval-atrial junction. The PICC line is ready for immediate use. Read by:  Brynda Greathouse PA-C Electronically Signed   By: Markus Daft M.D.   On: 02/19/2017 10:51   Ir US Guide Vasc Access Right  Result Date: 02/19/2017 INDICATION: Patient with poor venous access. Request is made for peripherally inserted central catheter. EXAM: ULTRASOUND AND FLUOROSCOPIC GUIDED PICC LINE INSERTION MEDICATIONS: 3 mL 1%  lidocaine CONTRAST:  3 mL FLUOROSCOPY TIME:  60 seconds COMPLICATIONS: None immediate. TECHNIQUE: The procedure,  risks, benefits, and alternatives were explained to the patient and informed written consent was obtained. A timeout was performed prior to the initiation of the procedure. The right upper extremity was prepped with chlorhexidine in a sterile fashion, and a sterile drape was applied covering the operative field. Maximum barrier sterile technique with sterile gowns and gloves were used for the procedure. A timeout was performed prior to the initiation of the procedure. Local anesthesia was provided with 1% lidocaine. Under direct ultrasound guidance, the right basilic vein was accessed with a micropuncture kit after the overlying soft tissues were anesthetized with 1% lidocaine. An ultrasound image was saved for documentation purposes. A guidewire was advanced to the level of the superior caval-atrial junction for measurement purposes and the PICC line was cut to length. Catheter could not be advanced initially, so 3 mL of Omnipaque were injected and venography performed which reveal catheter in good position. A peel-away sheath was placed and a 39 cm, 5 [place], dual lumen was inserted to level of the superior caval-atrial junction. A post procedure spot fluoroscopic was obtained. The catheter easily aspirated and flushed and was secured in place. A dressing was placed. The patient tolerated the procedure well without immediate post procedural complication. FINDINGS: After catheter placement, the tip lies within the superior cavoatrial junction. The catheter aspirates and flushes normally and is ready for immediate use. IMPRESSION: Successful ultrasound and fluoroscopic guided placement of a right basilic vein approach, 39 cm, 5 French, dual lumen PICC with tip at the superior caval-atrial junction. The PICC line is ready for immediate use. Read by:  Brynda Greathouse PA-C Electronically Signed   By: Markus Daft M.D.   On: 02/19/2017 10:51    Medications: I have reviewed the patient's current  medications.  Assessment/Plan: 1. Squamous cell carcinoma the anus  staging CTs 12/28/2016 confirmed an anorectal mass, inguinal/iliac adenopathy  Initiation of radiation and cycle 1 5-FU/mitomycin C on 01/22/2017  Cycle 2 5-FU/mitomycin C 02/19/2017 2. Rectal pain and bleeding secondary to #1 3. Mucositis secondary to chemotherapy. Resolved. 4. Anemia secondary to bleeding related to #1, chemotherapy 5. Neutropenia and thrombocytopenia following cycle 1 5-FU/mitomycin C, resolved 6. Left pubic skin infection.   Disposition: Ms. Francine Graven appears stable. The plan is to proceed with cycle 2 5-FU/mitomycin-C today as scheduled. The chemotherapy will be dose reduced due to mucositis and neutropenia/thrombocytopenia following cycle 1.  She appears to have a skin infection/abscess at the left pubic region. There is a similar lesion at the left buttock. She will complete a 10 day course of doxycycline.  She has significant ulceration at the perineum/labia. We will ask radiation oncology regarding topical treatment.  She will return for labs and a follow-up visit on 03/01/2017. She will contact the office in the interim with any problems.  Patient seen with Dr. Benay Spice. An interpreter was present throughout today's visit.    Ned Card ANP/GNP-BC   02/19/2017  12:31 PM  This was a shared visit with Ned Card. Ms.Aracena was interviewed and examined. The plan is to proceed with cycle 2 5-FU/mitomycin-C today. The chemotherapy will be dose reduced secondary to mucositis and cytopenias following cycle 1.  She will complete a course of doxycycline for the pustules at the left pubic area.  Julieanne Manson, M.D.

## 2017-02-19 NOTE — Procedures (Signed)
Successful placement of single lumen PICC line to basilic vein. Length 39cm Tip at lower SVC/RA No complications Ready for use.  Docia Barrier PA-C 10:43 AM

## 2017-02-20 ENCOUNTER — Ambulatory Visit
Admission: RE | Admit: 2017-02-20 | Discharge: 2017-02-20 | Disposition: A | Discharge status: Home / Self Care | Payer: BLUE CROSS/BLUE SHIELD | Source: Ambulatory Visit | Attending: Radiation Oncology | Admitting: Radiation Oncology

## 2017-02-20 ENCOUNTER — Telehealth: Payer: Self-pay | Admitting: Oncology

## 2017-02-20 DIAGNOSIS — R300 Dysuria: Secondary | ICD-10-CM | POA: Diagnosis not present

## 2017-02-20 NOTE — Telephone Encounter (Signed)
No LOS per 02/19/17 los

## 2017-02-21 ENCOUNTER — Ambulatory Visit
Admission: RE | Admit: 2017-02-21 | Discharge: 2017-02-21 | Disposition: A | Discharge status: Home / Self Care | Payer: BLUE CROSS/BLUE SHIELD | Source: Ambulatory Visit | Attending: Radiation Oncology | Admitting: Radiation Oncology

## 2017-02-21 ENCOUNTER — Encounter: Payer: BLUE CROSS/BLUE SHIELD | Admitting: Nutrition

## 2017-02-21 DIAGNOSIS — R300 Dysuria: Secondary | ICD-10-CM | POA: Diagnosis not present

## 2017-02-21 NOTE — Progress Notes (Addendum)
Weekly rad txs 22/30 completed pelvis, has moist desquamation between buttocks, c/o nausea thethrew up 10 minutes after taking compazine, using sitz baths2x day and baby wipes, no diarrhea regular stools, no blood, regular voiding, only when wiping bottom streak blood, , has los 2 lbs since 02/16/17, using A&D ointment which is helped stated patient, bid 11:38 AM. BP 123/74 (BP Location: Left Arm, Patient Position: Sitting, Cuff Size: Normal)   Pulse 74   Temp 98.5 F (36.9 C) (Oral)   Resp 18   Wt 118 lb 9.6 oz (53.8 kg)   BMI 21.69 kg/m

## 2017-02-22 ENCOUNTER — Ambulatory Visit
Admission: RE | Admit: 2017-02-22 | Discharge: 2017-02-22 | Disposition: A | Discharge status: Home / Self Care | Payer: BLUE CROSS/BLUE SHIELD | Source: Ambulatory Visit | Attending: Radiation Oncology | Admitting: Radiation Oncology

## 2017-02-22 DIAGNOSIS — R300 Dysuria: Secondary | ICD-10-CM | POA: Diagnosis not present

## 2017-02-23 ENCOUNTER — Ambulatory Visit
Admission: RE | Admit: 2017-02-23 | Discharge: 2017-02-23 | Disposition: A | Discharge status: Home / Self Care | Payer: BLUE CROSS/BLUE SHIELD | Source: Ambulatory Visit | Attending: Radiation Oncology | Admitting: Radiation Oncology

## 2017-02-23 ENCOUNTER — Ambulatory Visit (HOSPITAL_BASED_OUTPATIENT_CLINIC_OR_DEPARTMENT_OTHER): Payer: BLUE CROSS/BLUE SHIELD

## 2017-02-23 VITALS — BP 125/75 | HR 76 | Temp 97.8°F | Resp 18

## 2017-02-23 DIAGNOSIS — C210 Malignant neoplasm of anus, unspecified: Secondary | ICD-10-CM

## 2017-02-23 DIAGNOSIS — R300 Dysuria: Secondary | ICD-10-CM | POA: Diagnosis not present

## 2017-02-23 MED ORDER — HEPARIN SOD (PORK) LOCK FLUSH 100 UNIT/ML IV SOLN
500.0000 [IU] | Freq: Once | INTRAVENOUS | Status: DC | PRN
  Filled 2017-02-23: qty 5

## 2017-02-23 MED ORDER — SODIUM CHLORIDE 0.9% FLUSH
10.0000 mL | INTRAVENOUS | Status: DC | PRN
  Administered 2017-02-23: 10 mL
  Filled 2017-02-23: qty 10

## 2017-02-23 NOTE — Patient Instructions (Signed)
PICC Removal, Care After Refer to this sheet in the next few weeks. These instructions provide you with information on caring for yourself after your procedure. Your health care provider may also give you more specific instructions. Your treatment has been planned according to current medical practices, but problems sometimes occur. Call your health care provider if you have any problems or questions after your procedure. What can I expect after the procedure? After your procedure, it is typical to have mild discomfort at the insertion site. This should not last for more than a day. Follow these instructions at home: You may remove the bandage after 24 hours. The PICC insertion site is very small. A small scab may develop over the insertion site. It is okay to wash the site gently with soap and water. Be careful not to remove or pick off the scab. Gently pat the site dry after washing it. You do not need to put another bandage over the insertion site. Do not lift anything heavy or do strenuous physical activity for 24 hours after the PICC is removed. This includes:  Weight lifting.  Strenuous yard work.  Any physical activity with repetitive arm movement.  Contact a health care provider if:  You have swelling or puffiness in your arm at the PICC insertion site.  You have increasing tenderness at the PICC insertion site. Get help right away if:  You have numbness or tingling in your fingers, hand, or arm.  Your arm looks blue and feels cold.  You have redness around the insertion site or a red streak goes up your arm.  You have any type of drainage from the PICC insertion site. This includes drainage such as: ? Bleeding from the insertion site. If this happens, apply firm, direct pressure to the PICC insertion site with a clean towel. ? Drainage that is yellow or tan.  You have a fever. This information is not intended to replace advice given to you by your health care provider. Make  sure you discuss any questions you have with your health care provider. Document Released: 10/28/2013 Document Revised: 03/30/2016 Document Reviewed: 08/15/2013 Elsevier Interactive Patient Education  2017 [employer]vier Inc.  

## 2017-02-23 NOTE — Progress Notes (Signed)
Alexis RN removed PICC, VSS after 30 observation. Pt ate and drank. Discharged in stable condition, and homeostasis maintained.

## 2017-02-26 ENCOUNTER — Other Ambulatory Visit: Payer: Self-pay | Admitting: Radiation Oncology

## 2017-02-26 ENCOUNTER — Encounter: Payer: Self-pay | Admitting: *Deleted

## 2017-02-26 ENCOUNTER — Ambulatory Visit
Admission: RE | Admit: 2017-02-26 | Discharge: 2017-02-26 | Disposition: A | Discharge status: Home / Self Care | Payer: BLUE CROSS/BLUE SHIELD | Source: Ambulatory Visit | Attending: Radiation Oncology | Admitting: Radiation Oncology

## 2017-02-26 DIAGNOSIS — R300 Dysuria: Secondary | ICD-10-CM

## 2017-02-26 DIAGNOSIS — C210 Malignant neoplasm of anus, unspecified: Secondary | ICD-10-CM

## 2017-02-26 LAB — CBC WITH DIFFERENTIAL/PLATELET
BASO%: 0.1 % (ref 0.0–2.0)
Basophils Absolute: 0 10*3/uL (ref 0.0–0.1)
EOS%: 8.8 % — ABNORMAL HIGH (ref 0.0–7.0)
Eosinophils Absolute: 0.2 10*3/uL (ref 0.0–0.5)
HCT: 29.5 % — ABNORMAL LOW (ref 34.8–46.6)
HEMOGLOBIN: 9.5 g/dL — AB (ref 11.6–15.9)
LYMPH%: 3.9 % — ABNORMAL LOW (ref 14.0–49.7)
MCH: 26.2 pg (ref 25.1–34.0)
MCHC: 32.3 g/dL (ref 31.5–36.0)
MCV: 81.3 fL (ref 79.5–101.0)
MONO#: 0.1 10*3/uL (ref 0.1–0.9)
MONO%: 4.5 % (ref 0.0–14.0)
NEUT%: 82.7 % — ABNORMAL HIGH (ref 38.4–76.8)
NEUTROS ABS: 2.2 10*3/uL (ref 1.5–6.5)
PLATELETS: 267 10*3/uL (ref 145–400)
RBC: 3.63 10*6/uL — AB (ref 3.70–5.45)
RDW: 18.9 % — AB (ref 11.2–14.5)
WBC: 2.6 10*3/uL — AB (ref 3.9–10.3)
lymph#: 0.1 10*3/uL — ABNORMAL LOW (ref 0.9–3.3)

## 2017-02-26 MED ORDER — PROCHLORPERAZINE MALEATE 10 MG PO TABS: 10.0000 mg | ORAL_TABLET | Freq: Four times a day (QID) | ORAL | 1 refills | Status: DC | PRN

## 2017-02-26 NOTE — Progress Notes (Signed)
1230 Brought by Radiation tech requested a refill on nausea medication taking compazine 10 mg prn every 6 hours refill called in to her drug store and asked to pick up AZO for complaint of burning on urination, taken to the lab for collection of a urine specimen.  Asked to pick up AZO to help with burning on urination per Shona Simpson, P.A..  97.7-82-16  100% 102/89

## 2017-02-27 ENCOUNTER — Other Ambulatory Visit: Payer: Self-pay | Admitting: Radiation Oncology

## 2017-02-27 ENCOUNTER — Ambulatory Visit: Payer: BLUE CROSS/BLUE SHIELD | Admitting: Nutrition

## 2017-02-27 ENCOUNTER — Ambulatory Visit
Admission: RE | Admit: 2017-02-27 | Discharge: 2017-02-27 | Disposition: A | Discharge status: Home / Self Care | Payer: BLUE CROSS/BLUE SHIELD | Source: Ambulatory Visit | Attending: Radiation Oncology | Admitting: Radiation Oncology

## 2017-02-27 DIAGNOSIS — C210 Malignant neoplasm of anus, unspecified: Secondary | ICD-10-CM

## 2017-02-27 DIAGNOSIS — R300 Dysuria: Secondary | ICD-10-CM | POA: Diagnosis not present

## 2017-02-27 DIAGNOSIS — R112 Nausea with vomiting, unspecified: Secondary | ICD-10-CM

## 2017-02-27 MED ORDER — PROCHLORPERAZINE MALEATE 10 MG PO TABS: 10.0000 mg | ORAL_TABLET | Freq: Four times a day (QID) | ORAL | 1 refills | Status: DC | PRN

## 2017-02-27 MED ORDER — ONDANSETRON HCL 4 MG PO TABS
8.0000 mg | ORAL_TABLET | Freq: Once | ORAL | Status: DC
  Filled 2017-02-27: qty 2

## 2017-02-27 MED ORDER — ONDANSETRON HCL 8 MG PO TABS: 8.0000 mg | ORAL_TABLET | Freq: Three times a day (TID) | ORAL | 3 refills | Status: DC

## 2017-02-27 MED ORDER — ONDANSETRON HCL 4 MG PO TABS
4.0000 mg | ORAL_TABLET | Freq: Once | ORAL | Status: AC
  Administered 2017-02-27: 4 mg via ORAL
  Filled 2017-02-27: qty 1

## 2017-02-27 MED ORDER — PROCHLORPERAZINE MALEATE 10 MG PO TABS
10.0000 mg | ORAL_TABLET | Freq: Four times a day (QID) | ORAL | Status: DC | PRN
  Administered 2017-02-27: 10 mg via ORAL
  Filled 2017-02-27: qty 1

## 2017-02-27 NOTE — Progress Notes (Signed)
The patient was seen at the machine today complaining of nausea. Despite e-prescribing a new script for compazine yesterday, this did not go thru. She had emesis following her treatment without new onset of abdominal pain. She was given Zofran 4mg  and Compazine 10 mg  Following her treatment both po. A new compazine rx and Zofran 8mg  rx were sent in to [facility] for her to use, and I encouraged her to use Zofran scheduled TID with compazine prn in addition.

## 2017-02-27 NOTE — Progress Notes (Signed)
I received notice that patient was unable to attend nutrition consultation today. She is feeling nauseated. Patient presented with interpreter who requested nutrition appointment later this week.  However, I do not have any openings. Provided fact sheets and contact information. Interpreter reports son speaks good English and can assist his mother with nutrition information. Interpreter wishes to call me to reschedule patient if she desires nutrition appointment as interpreter will provide transportation.

## 2017-02-28 ENCOUNTER — Ambulatory Visit
Admission: RE | Admit: 2017-02-28 | Discharge: 2017-02-28 | Disposition: A | Discharge status: Home / Self Care | Payer: BLUE CROSS/BLUE SHIELD | Source: Ambulatory Visit | Attending: Radiation Oncology | Admitting: Radiation Oncology

## 2017-02-28 ENCOUNTER — Telehealth: Payer: Self-pay | Admitting: Radiation Oncology

## 2017-02-28 DIAGNOSIS — R300 Dysuria: Secondary | ICD-10-CM | POA: Diagnosis not present

## 2017-02-28 LAB — URINE CULTURE

## 2017-02-28 NOTE — Telephone Encounter (Signed)
Per Shona Simpson' order phoned patient's home. Spoke with her son, Gershon Mussel. Explained his mother's lab test returned and she doesn't not have an infection in her urine requiring an antibiotic. Explained she should continue to use AZO to manage her symptoms. Tom verbalized understanding and expressed appreciation for the return call.

## 2017-02-28 NOTE — Telephone Encounter (Signed)
-----   Message from Hayden Pedro, PA-C sent at 02/28/2017  8:46 AM EDT -----  Can you tell her no infection? Symptoms need to be managed with AZO this is probably treatment related ----- Message ----- From: Interface, Lab In Three Zero One Sent: 02/26/2017   1:20 PM To: Hayden Pedro, PA-C

## 2017-03-01 ENCOUNTER — Other Ambulatory Visit (HOSPITAL_BASED_OUTPATIENT_CLINIC_OR_DEPARTMENT_OTHER): Payer: BLUE CROSS/BLUE SHIELD

## 2017-03-01 ENCOUNTER — Ambulatory Visit (HOSPITAL_BASED_OUTPATIENT_CLINIC_OR_DEPARTMENT_OTHER): Payer: BLUE CROSS/BLUE SHIELD | Admitting: Oncology

## 2017-03-01 ENCOUNTER — Telehealth: Payer: Self-pay | Admitting: Oncology

## 2017-03-01 ENCOUNTER — Ambulatory Visit
Admission: RE | Admit: 2017-03-01 | Discharge: 2017-03-01 | Disposition: A | Discharge status: Home / Self Care | Payer: BLUE CROSS/BLUE SHIELD | Source: Ambulatory Visit | Attending: Radiation Oncology | Admitting: Radiation Oncology

## 2017-03-01 VITALS — BP 120/63 | HR 81 | Temp 97.8°F | Resp 18 | Ht 62.0 in | Wt 114.5 lb

## 2017-03-01 DIAGNOSIS — K6289 Other specified diseases of anus and rectum: Secondary | ICD-10-CM

## 2017-03-01 DIAGNOSIS — C210 Malignant neoplasm of anus, unspecified: Secondary | ICD-10-CM | POA: Diagnosis not present

## 2017-03-01 DIAGNOSIS — D500 Iron deficiency anemia secondary to blood loss (chronic): Secondary | ICD-10-CM | POA: Diagnosis not present

## 2017-03-01 DIAGNOSIS — D6481 Anemia due to antineoplastic chemotherapy: Secondary | ICD-10-CM

## 2017-03-01 DIAGNOSIS — G893 Neoplasm related pain (acute) (chronic): Secondary | ICD-10-CM

## 2017-03-01 DIAGNOSIS — R300 Dysuria: Secondary | ICD-10-CM | POA: Diagnosis not present

## 2017-03-01 DIAGNOSIS — K629 Disease of anus and rectum, unspecified: Secondary | ICD-10-CM

## 2017-03-01 DIAGNOSIS — R197 Diarrhea, unspecified: Secondary | ICD-10-CM

## 2017-03-01 LAB — CBC WITH DIFFERENTIAL/PLATELET
BASO%: 0.1 % (ref 0.0–2.0)
Basophils Absolute: 0 10*3/uL (ref 0.0–0.1)
EOS%: 18.2 % — AB (ref 0.0–7.0)
Eosinophils Absolute: 0.4 10*3/uL (ref 0.0–0.5)
HCT: 29.1 % — ABNORMAL LOW (ref 34.8–46.6)
HGB: 9.4 g/dL — ABNORMAL LOW (ref 11.6–15.9)
LYMPH%: 5.5 % — ABNORMAL LOW (ref 14.0–49.7)
MCH: 26.3 pg (ref 25.1–34.0)
MCHC: 32.2 g/dL (ref 31.5–36.0)
MCV: 81.9 fL (ref 79.5–101.0)
MONO#: 0.2 10*3/uL (ref 0.1–0.9)
MONO%: 10.4 % (ref 0.0–14.0)
NEUT%: 65.8 % (ref 38.4–76.8)
NEUTROS ABS: 1.4 10*3/uL — AB (ref 1.5–6.5)
Platelets: 269 10*3/uL (ref 145–400)
RBC: 3.55 10*6/uL — ABNORMAL LOW (ref 3.70–5.45)
RDW: 18.6 % — AB (ref 11.2–14.5)
WBC: 2.1 10*3/uL — AB (ref 3.9–10.3)
lymph#: 0.1 10*3/uL — ABNORMAL LOW (ref 0.9–3.3)

## 2017-03-01 MED ORDER — POLYETHYLENE GLYCOL 3350 17 GM/SCOOP PO POWD: 34.0000 g | Freq: Every day | ORAL | 5 refills | Status: DC | PRN

## 2017-03-01 NOTE — Progress Notes (Signed)
Late entry for 02/23/17. Okay to d/c PICC per Dr. Benay Spice.  PICC flushed and site cleaned per [facility] policy. PICC removed measuring at 39cm. Tip intact. Pressure held x 5 mins. Vaseline dressing, gauze, and coban pressure dressing applied. Instructed patient not to lift over 10 lbs or shower x 24 hours, and to call us with s/s infection or bleeding occurs. Patient and daughter both voice understanding.

## 2017-03-01 NOTE — Addendum Note (Signed)
Addended by: Adalberto Cole on: 03/01/2017 03:22 PM   Modules accepted: Orders

## 2017-03-01 NOTE — Telephone Encounter (Signed)
Gave patient AVS and calender per 4/26.

## 2017-03-01 NOTE — Progress Notes (Signed)
  [place] OFFICE PROGRESS NOTE   Diagnosis:  Anal cancer  INTERVAL HISTORY:   She returns as scheduled. She completed the second cycle of chemotherapy beginning 02/19/2017. The 5-fluorouracil and mitomycin C were dose reduced. She had nausea over the days following chemotherapy. This has improved. She complains of diarrhea multiple times per day. She is taking MiraLAX twice daily. No mouth sores. The area of induration at the left pubic region has improved following doxycycline.  Objective:  Vital signs in last 24 hours:  Blood pressure 120/63, pulse 81, temperature 97.8 F (36.6 C), temperature source Oral, resp. rate 18, height 5\' 2"  (1.575 m), weight 114 lb 8 oz (51.9 kg), SpO2 100 %.    HEENT: No thrush or ulcers Lymphatics: No inguinal nodes Resp: Lungs clear bilaterally Cardio: Regular rate and rhythm GI: No hepatosplenomegaly, nontender Vascular: No leg edema  Skin: Superficial ulceration at the perineum and upper labia . The area of erythema/induration at the left pubic region has resolved.    Lab Results:  Lab Results  Component Value Date   WBC 2.1 (L) 03/01/2017   HGB 9.4 (L) 03/01/2017   HCT 29.1 (L) 03/01/2017   MCV 81.9 03/01/2017   PLT 269 03/01/2017   NEUTROABS 1.4 (L) 03/01/2017     Medications: I have reviewed the patient's current medications.  Assessment/Plan: 1. Squamous cell carcinoma the anus  staging CTs 12/28/2016 confirmed an anorectal mass, inguinal/iliac adenopathy  Initiation of radiation and cycle 1 5-FU/mitomycin C on 01/22/2017  Cycle 2 5-FU/mitomycin C 02/19/2017 2. Rectal pain and bleeding secondary to #1 3. Mucositis secondary to chemotherapy. Resolved. 4. Anemia secondary to bleeding related to #1, chemotherapy 5. Neutropenia and thrombocytopenia following cycle 1 5-FU/mitomycin C, resolved 6. Left pubic skin infection-Resolved following course of doxycycline 7. Diarrhea 03/01/2017-likely secondary to  chemotherapy, doxycycline, and MiraLAX    Disposition:  She is now at day 11 following cycle 2 5-FU/mitomycin-C. She has tolerated this cycle of chemotherapy with less hematologic toxicity. The diarrhea may be related to continued use of MiraLAX, chemotherapy, and recent doxycycline. She will discontinue MiraLAX and begin Imodium. She will contact us if the diarrhea does not improve.  Ms. Francine Graven will return for an office and lab visit in 2 weeks. She will complete radiation tomorrow.  15 minutes were spent with the patient today. The majority of the time was used for counseling and coordination of care.  Betsy Coder, MD  03/01/2017  9:53 AM

## 2017-03-02 ENCOUNTER — Ambulatory Visit
Admission: RE | Admit: 2017-03-02 | Discharge: 2017-03-02 | Disposition: A | Discharge status: Home / Self Care | Payer: BLUE CROSS/BLUE SHIELD | Source: Ambulatory Visit | Attending: Radiation Oncology | Admitting: Radiation Oncology

## 2017-03-02 ENCOUNTER — Ambulatory Visit: Payer: BLUE CROSS/BLUE SHIELD

## 2017-03-02 DIAGNOSIS — R300 Dysuria: Secondary | ICD-10-CM | POA: Diagnosis not present

## 2017-03-05 ENCOUNTER — Ambulatory Visit
Admission: RE | Admit: 2017-03-05 | Discharge: 2017-03-05 | Disposition: A | Discharge status: Home / Self Care | Payer: BLUE CROSS/BLUE SHIELD | Source: Ambulatory Visit | Attending: Radiation Oncology | Admitting: Radiation Oncology

## 2017-03-05 DIAGNOSIS — R300 Dysuria: Secondary | ICD-10-CM | POA: Diagnosis not present

## 2017-03-14 ENCOUNTER — Encounter: Payer: Self-pay | Admitting: Radiation Oncology

## 2017-03-14 NOTE — Progress Notes (Signed)
  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 Patient had mild radiation changes to the treatment area and moist desquamation to the anal region. She complained of pain with bowel movements and scant blood after wiping.  Plan: The patient has completed radiation treatment. The patient will return to radiation oncology clinic for routine followup in one month. I advised them to call or return sooner if they have any questions or concerns related to their recovery or treatment.  ------------------------------------------------  Jodelle Gross, MD, PhD  This document serves as a record of services personally performed by Kyung Rudd, MD. It was created on his behalf by Bethann Humble, a trained medical scribe. The creation of this record is based on the scribe's personal observations and the provider's statements to them. This document has been checked and approved by the attending provider.

## 2017-03-16 ENCOUNTER — Telehealth: Payer: Self-pay | Admitting: Nurse Practitioner

## 2017-03-16 ENCOUNTER — Other Ambulatory Visit (HOSPITAL_BASED_OUTPATIENT_CLINIC_OR_DEPARTMENT_OTHER): Payer: BLUE CROSS/BLUE SHIELD

## 2017-03-16 ENCOUNTER — Ambulatory Visit (HOSPITAL_BASED_OUTPATIENT_CLINIC_OR_DEPARTMENT_OTHER): Payer: BLUE CROSS/BLUE SHIELD | Admitting: Nurse Practitioner

## 2017-03-16 VITALS — BP 107/76 | HR 93 | Temp 98.9°F | Resp 18 | Ht 62.0 in | Wt 111.3 lb

## 2017-03-16 DIAGNOSIS — T50905A Adverse effect of unspecified drugs, medicaments and biological substances, initial encounter: Secondary | ICD-10-CM | POA: Diagnosis not present

## 2017-03-16 DIAGNOSIS — D6481 Anemia due to antineoplastic chemotherapy: Secondary | ICD-10-CM

## 2017-03-16 DIAGNOSIS — G893 Neoplasm related pain (acute) (chronic): Secondary | ICD-10-CM

## 2017-03-16 DIAGNOSIS — C210 Malignant neoplasm of anus, unspecified: Secondary | ICD-10-CM

## 2017-03-16 DIAGNOSIS — K629 Disease of anus and rectum, unspecified: Secondary | ICD-10-CM

## 2017-03-16 DIAGNOSIS — K6289 Other specified diseases of anus and rectum: Secondary | ICD-10-CM

## 2017-03-16 DIAGNOSIS — D500 Iron deficiency anemia secondary to blood loss (chronic): Secondary | ICD-10-CM | POA: Diagnosis not present

## 2017-03-16 LAB — CBC WITH DIFFERENTIAL/PLATELET
BASO%: 1.1 % (ref 0.0–2.0)
Basophils Absolute: 0 10*3/uL (ref 0.0–0.1)
EOS%: 1.4 % (ref 0.0–7.0)
Eosinophils Absolute: 0 10*3/uL (ref 0.0–0.5)
HCT: 31 % — ABNORMAL LOW (ref 34.8–46.6)
HGB: 9.7 g/dL — ABNORMAL LOW (ref 11.6–15.9)
LYMPH%: 12 % — AB (ref 14.0–49.7)
MCH: 26 pg (ref 25.1–34.0)
MCHC: 31.2 g/dL — ABNORMAL LOW (ref 31.5–36.0)
MCV: 83.5 fL (ref 79.5–101.0)
MONO#: 0.4 10*3/uL (ref 0.1–0.9)
MONO%: 13.5 % (ref 0.0–14.0)
NEUT%: 72 % (ref 38.4–76.8)
NEUTROS ABS: 2.2 10*3/uL (ref 1.5–6.5)
Platelets: 231 10*3/uL (ref 145–400)
RBC: 3.72 10*6/uL (ref 3.70–5.45)
RDW: 24.4 % — AB (ref 11.2–14.5)
WBC: 3.1 10*3/uL — AB (ref 3.9–10.3)
lymph#: 0.4 10*3/uL — ABNORMAL LOW (ref 0.9–3.3)

## 2017-03-16 NOTE — Telephone Encounter (Signed)
Gave patient AVS and calender per 5/11 los.

## 2017-03-16 NOTE — Progress Notes (Signed)
  [place] OFFICE PROGRESS NOTE   Diagnosis:  Anal cancer  INTERVAL HISTORY:   She returns as scheduled. She completed cycle 2 5-FU/mitomycin C 02/19/2017. She completed radiation 03/05/2017. She notes burning with urination. No bleeding. She denies nausea/vomiting. No mouth sores. No diarrhea. Appetite is diminished. She reports good fluid intake.  Objective:  Vital signs in last 24 hours:  Blood pressure 107/76, pulse 93, temperature 98.9 F (37.2 C), temperature source Oral, resp. rate 18, height 5\' 2"  (1.575 m), weight 111 lb 4.8 oz (50.5 kg), SpO2 100 %.    HEENT: No thrush or ulcers. Resp: Lungs clear bilaterally. Cardio: Regular rate and rhythm. GI: Abdomen soft and nontender. No hepatomegaly. Vascular: No leg edema.  Skin: Skin over the pubic and groin regions has healed. Multiple ulcerations beginning at the labia extending to the perineum, perianal skin and gluteal fold.    Lab Results:  Lab Results  Component Value Date   WBC 3.1 (L) 03/16/2017   HGB 9.7 (L) 03/16/2017   HCT 31.0 (L) 03/16/2017   MCV 83.5 03/16/2017   PLT 231 03/16/2017   NEUTROABS 2.2 03/16/2017    Imaging:  No results found.  Medications: I have reviewed the patient's current medications.  Assessment/Plan: 1. Squamous cell carcinoma the anus  staging CTs 12/28/2016 confirmed an anorectal mass, inguinal/iliac adenopathy  Initiation of radiation and cycle 1 5-FU/mitomycin C on 01/22/2017  Cycle 2 5-FU/mitomycin C 02/19/2017 2. Rectal pain and bleeding secondary to #1 3. Mucositis secondary to chemotherapy. Resolved. 4. Anemia secondary to bleeding related to #1, chemotherapy 5. Neutropenia and thrombocytopenia following cycle 1 5-FU/mitomycin C, resolved 6. Left pubic skin infection-Resolved following course of doxycycline 7. Diarrhea 03/01/2017-likely secondary to chemotherapy, doxycycline, and MiraLAX 8. Skin toxicity secondary to radiation   Disposition: Ms.  Francine Graven appears stable. She completed the course of radiation 03/05/2017. She has skin toxicity involving the labia/perineum/perianal region. She will apply a barrier cream to see if this helps with the burning when she urinates.  She has lost some weight since her last visit. She reports adequate fluid intake but is having some difficulty with appetite. She will begin nutritional supplements.  She will return for a follow-up visit in 2 weeks. She will contact the office in the interim with any problems.  Plan reviewed with Dr. Benay Spice.  Ned Card ANP/GNP-BC   03/16/2017  10:27 AM

## 2017-03-30 ENCOUNTER — Telehealth: Payer: Self-pay | Admitting: Oncology

## 2017-03-30 ENCOUNTER — Ambulatory Visit (HOSPITAL_BASED_OUTPATIENT_CLINIC_OR_DEPARTMENT_OTHER): Payer: BLUE CROSS/BLUE SHIELD | Admitting: Nurse Practitioner

## 2017-03-30 ENCOUNTER — Other Ambulatory Visit (HOSPITAL_BASED_OUTPATIENT_CLINIC_OR_DEPARTMENT_OTHER): Payer: BLUE CROSS/BLUE SHIELD

## 2017-03-30 VITALS — BP 120/63 | HR 80 | Temp 98.1°F | Resp 18 | Ht 62.0 in | Wt 114.2 lb

## 2017-03-30 DIAGNOSIS — D6481 Anemia due to antineoplastic chemotherapy: Secondary | ICD-10-CM

## 2017-03-30 DIAGNOSIS — R197 Diarrhea, unspecified: Secondary | ICD-10-CM | POA: Diagnosis not present

## 2017-03-30 DIAGNOSIS — L089 Local infection of the skin and subcutaneous tissue, unspecified: Secondary | ICD-10-CM

## 2017-03-30 DIAGNOSIS — C210 Malignant neoplasm of anus, unspecified: Secondary | ICD-10-CM

## 2017-03-30 DIAGNOSIS — D500 Iron deficiency anemia secondary to blood loss (chronic): Secondary | ICD-10-CM | POA: Diagnosis not present

## 2017-03-30 LAB — CBC WITH DIFFERENTIAL/PLATELET
BASO%: 0.4 % (ref 0.0–2.0)
BASOS ABS: 0 10*3/uL (ref 0.0–0.1)
EOS ABS: 0.1 10*3/uL (ref 0.0–0.5)
EOS%: 1.8 % (ref 0.0–7.0)
HCT: 28.3 % — ABNORMAL LOW (ref 34.8–46.6)
HEMOGLOBIN: 8.7 g/dL — AB (ref 11.6–15.9)
LYMPH%: 23.7 % (ref 14.0–49.7)
MCH: 26.9 pg (ref 25.1–34.0)
MCHC: 30.7 g/dL — ABNORMAL LOW (ref 31.5–36.0)
MCV: 87.3 fL (ref 79.5–101.0)
MONO#: 0.3 10*3/uL (ref 0.1–0.9)
MONO%: 11.3 % (ref 0.0–14.0)
NEUT%: 62.8 % (ref 38.4–76.8)
NEUTROS ABS: 1.8 10*3/uL (ref 1.5–6.5)
PLATELETS: 261 10*3/uL (ref 145–400)
RBC: 3.24 10*6/uL — ABNORMAL LOW (ref 3.70–5.45)
RDW: 23.3 % — AB (ref 11.2–14.5)
WBC: 2.8 10*3/uL — AB (ref 3.9–10.3)
lymph#: 0.7 10*3/uL — ABNORMAL LOW (ref 0.9–3.3)

## 2017-03-30 MED ORDER — CEPHALEXIN 500 MG PO CAPS: 500.0000 mg | ORAL_CAPSULE | Freq: Three times a day (TID) | ORAL | 0 refills | Status: DC

## 2017-03-30 NOTE — Telephone Encounter (Signed)
Appointments scheduled per 03/30/17 los. Patient was given a copy of the AVS report and appointment schedule per 03/30/17 los. °

## 2017-03-30 NOTE — Progress Notes (Signed)
  [place] OFFICE PROGRESS NOTE   Diagnosis:  Anal cancer  INTERVAL HISTORY:   Ms. Francine Graven returns as scheduled. She reports an improved appetite and energy level. No pain except with bowel movements. She denies nausea.  Objective:  Vital signs in last 24 hours:  Blood pressure 120/63, pulse 80, temperature 98.1 F (36.7 C), temperature source Oral, resp. rate 18, height 5\' 2"  (1.575 m), weight 114 lb 3.2 oz (51.8 kg), SpO2 100 %.    HEENT: No thrush or ulcers. Lymphatics: No palpable inguinal lymph nodes. Resp: Lungs clear bilaterally. Cardio: Regular rate and rhythm. GI: Abdomen soft and nontender. No hepatomegaly. Vascular: No leg edema. Skin: Superficial linear ulceration gluteal fold. Skin otherwise appears healed. Radiation skin change noted. Right pubic region with approximate 1 cm erythematous, indurated skin lesion.    Lab Results:  Lab Results  Component Value Date   WBC 2.8 (L) 03/30/2017   HGB 8.7 (L) 03/30/2017   HCT 28.3 (L) 03/30/2017   MCV 87.3 03/30/2017   PLT 261 03/30/2017   NEUTROABS 1.8 03/30/2017    Imaging:  No results found.  Medications: I have reviewed the patient's current medications.  Assessment/Plan: 1. Squamous cell carcinoma the anus  staging CTs 12/28/2016 confirmed an anorectal mass, inguinal/iliac adenopathy  Initiation of radiation and cycle 1 5-FU/mitomycin C on 01/22/2017; radiation completed 03/05/2017  Cycle 2 5-FU/mitomycin C 02/19/2017 2. Rectal pain and bleeding secondary to #1-Improved. 3. Mucositis secondary to chemotherapy. Resolved. 4. Anemia secondary to bleeding related to #1, chemotherapy. Oral iron initiated 03/30/2017. 5. Neutropenia and thrombocytopenia following cycle 1 5-FU/mitomycin C, resolved 6. Left pubic skin infection-Resolved following course of doxycycline; right pubic skin infection 03/30/2017. Course of Keflex prescribed. 7. Diarrhea 03/01/2017-likely secondary to  chemotherapy, doxycycline, and MiraLAX 8. Skin toxicity secondary to radiation   Disposition: Ms. Francine Graven appears stable. She continues to recover from the radiation and chemotherapy. The skin is nearly healed. The plan is to obtain follow-up pelvic CT 4-6 months following completion of treatment.  She has persistent anemia likely due to previous bleeding related to the anal cancer and chemotherapy. She will begin oral iron (ferrous sulfate 325 mg 3 times daily). We will obtain a CBC and ferritin when she returns in one month.  She appears to have a right pubic skin infection. She will complete a 7 day course of Keflex.  She will return for labs and a follow-up visit in one month. She will contact the office in the interim with any problems.  Plan reviewed with Dr. Benay Spice.    Ned Card ANP/GNP-BC   03/30/2017  10:38 AM

## 2017-03-30 NOTE — Patient Instructions (Signed)
Begin ferrous sulfate 325 mg three times a day 

## 2017-04-24 ENCOUNTER — Ambulatory Visit
Admission: RE | Admit: 2017-04-24 | Discharge: 2017-04-24 | Disposition: A | Discharge status: Home / Self Care | Payer: BLUE CROSS/BLUE SHIELD | Source: Ambulatory Visit | Attending: Radiation Oncology | Admitting: Radiation Oncology

## 2017-04-24 ENCOUNTER — Telehealth: Payer: Self-pay | Admitting: *Deleted

## 2017-04-24 ENCOUNTER — Encounter: Payer: Self-pay | Admitting: Radiation Oncology

## 2017-04-24 VITALS — BP 127/84 | HR 73 | Temp 98.3°F | Resp 16 | Ht 62.0 in | Wt 114.4 lb

## 2017-04-24 DIAGNOSIS — C210 Malignant neoplasm of anus, unspecified: Secondary | ICD-10-CM

## 2017-04-24 NOTE — Telephone Encounter (Signed)
PATIENT TO HAVE AN APPT. WITH DR. Johney Maine ON 07-02-17 - ARRIVAL TIME- 11:15 AM , APPT. @ [facility], AND HER FU WITH ALISON PERKINS ON 01-22-18 @ 8:30 AM, MAILED APPT. CARDS TO THIS PT.

## 2017-04-24 NOTE — Progress Notes (Signed)
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      Interval Since Last Radiation:  7 weeks   01/22/17-03/05/17: Anus treated to 54 Gy in 30 fractions  Narrative:  The patient returns today for routine follow-up. The pt tolerated radiotherapy well along with chemotherapy and continued to work throughout treatment. She was treated about a month ago for a carbuncle along the mons pubis.                            On review of systems, the patient states she is doing great and reports her skin continues to heal. She reports resolution of her previous skin infection. She denies any diarrhea at this time, fevers, chills, nausea, or vomiting. She reports occasional blood on toilet tissue after wiping. No other complaints are verbalized.  ALLERGIES:  has No Known Allergies.  Meds: Current Outpatient Prescriptions  Medication Sig Dispense Refill  . magic mouthwash SOLN Take 5 mLs by mouth 4 (four) times daily as needed for mouth pain. 120 mL 1  . naproxen sodium (ANAPROX) 220 MG tablet Take 220 mg by mouth as needed.    . Omega-3 Fatty Acids (FISH OIL PO) Take by mouth. Take 1 capsule by mouth 2 times daily.    . ondansetron (ZOFRAN) 8 MG tablet Take 1 tablet (8 mg total) by mouth 3 (three) times daily. 30 tablet 3  . oxyCODONE-acetaminophen (PERCOCET/ROXICET) 5-325 MG tablet Take 1-2 tablets by mouth every 6 (six) hours as needed for severe pain. 40 tablet 0  . polyethylene glycol powder (GLYCOLAX/MIRALAX) powder Take 34 g by mouth daily as needed for moderate constipation. (Patient not taking: Reported on 03/16/2017) 850 g 5  . prochlorperazine (COMPAZINE) 10 MG tablet Take 1 tablet (10 mg total) by mouth every 6 (six) hours as needed for nausea  or vomiting. (Patient not taking: Reported on 03/16/2017) 30 tablet 1   Current Facility-Administered Medications  Medication Dose Route Frequency Provider Last Rate Last Dose  . 0.9 %  sodium chloride infusion  500 mL Intravenous Continuous Armbruster, [name], MD        Physical Findings:  height is 5\' 2"  (1.575 m) and weight is 114 lb 6.4 oz (51.9 kg). Her oral temperature is 98.3 F (36.8 C). Her blood pressure is 127/84 and her pulse is 73. Her respiration is 16 and oxygen saturation is 100%.  Pain Assessment Pain Score: 0-No pain/10 In general this is a well appearing Asian female in no acute distress. She's alert and oriented x4 and appropriate throughout the examination. Cardiopulmonary assessment is negative for acute distress and she exhibits normal effort. Pelvic exam reveals well healed groins bilaterally without palpable adenopathy. There is evidence of thickening in a small are that was per report the previous site of infection that is about 41mm in dimension and the skin is not erythematous. No purulent matter is noted of the skin. The labial and perineal and perianal tissue is intact without desquamation. No visible tumor is seen. DRE is deferred.  Lab Findings: Lab Results  Component Value Date   WBC 2.8 (L) 03/30/2017   HGB 8.7 (L) 03/30/2017   HCT 28.3 (L) 03/30/2017   MCV 87.3  03/30/2017   PLT 261 03/30/2017     Radiographic Findings: No results found.  Impression/Plan: 1. 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 The patient appears to have a clinical response to treatment. She will return to see Dr. Benay Spice this week. I will plan to see her in 9 months time, and have explained the schedule of seeing Dr. Marcello Moores q3 months for anoscopy. Dr. Benay Spice and I will alternate seeing her as well. She will be due for a post treatment CT scan as well, which is being arranged by Dr. Benay Spice. She will call us with questions or concerns regarding her  care. 2. Prevention of vaginal stenosis. The patient is given dilators and lubricant to use at home and instructed on this. She will also be referred to meet with PT regarding this.      Carola Rhine, PAC

## 2017-04-24 NOTE — Addendum Note (Signed)
Encounter addended by: Malena Edman, RN on: 04/24/2017 10:45 AM<BR>    Actions taken: Charge Capture section accepted

## 2017-04-26 ENCOUNTER — Ambulatory Visit (HOSPITAL_BASED_OUTPATIENT_CLINIC_OR_DEPARTMENT_OTHER): Payer: BLUE CROSS/BLUE SHIELD | Admitting: Nurse Practitioner

## 2017-04-26 ENCOUNTER — Other Ambulatory Visit (HOSPITAL_BASED_OUTPATIENT_CLINIC_OR_DEPARTMENT_OTHER): Payer: BLUE CROSS/BLUE SHIELD

## 2017-04-26 ENCOUNTER — Telehealth: Payer: Self-pay | Admitting: Gastroenterology

## 2017-04-26 ENCOUNTER — Telehealth: Payer: Self-pay | Admitting: Nurse Practitioner

## 2017-04-26 VITALS — BP 129/79 | HR 74 | Temp 98.4°F | Resp 18 | Ht 62.0 in | Wt 116.4 lb

## 2017-04-26 DIAGNOSIS — C210 Malignant neoplasm of anus, unspecified: Secondary | ICD-10-CM

## 2017-04-26 DIAGNOSIS — D500 Iron deficiency anemia secondary to blood loss (chronic): Secondary | ICD-10-CM

## 2017-04-26 DIAGNOSIS — D6481 Anemia due to antineoplastic chemotherapy: Secondary | ICD-10-CM

## 2017-04-26 DIAGNOSIS — G893 Neoplasm related pain (acute) (chronic): Secondary | ICD-10-CM

## 2017-04-26 LAB — CBC WITH DIFFERENTIAL/PLATELET
BASO%: 0.6 % (ref 0.0–2.0)
Basophils Absolute: 0 10*3/uL (ref 0.0–0.1)
EOS%: 1.6 % (ref 0.0–7.0)
Eosinophils Absolute: 0 10*3/uL (ref 0.0–0.5)
HCT: 28.8 % — ABNORMAL LOW (ref 34.8–46.6)
HGB: 9.2 g/dL — ABNORMAL LOW (ref 11.6–15.9)
LYMPH%: 32.9 % (ref 14.0–49.7)
MCH: 28.5 pg (ref 25.1–34.0)
MCHC: 31.9 g/dL (ref 31.5–36.0)
MCV: 89.5 fL (ref 79.5–101.0)
MONO#: 0.3 10*3/uL (ref 0.1–0.9)
MONO%: 10.1 % (ref 0.0–14.0)
NEUT%: 54.8 % (ref 38.4–76.8)
NEUTROS ABS: 1.4 10*3/uL — AB (ref 1.5–6.5)
Platelets: 211 10*3/uL (ref 145–400)
RBC: 3.22 10*6/uL — AB (ref 3.70–5.45)
RDW: 24 % — ABNORMAL HIGH (ref 11.2–14.5)
WBC: 2.6 10*3/uL — AB (ref 3.9–10.3)
lymph#: 0.9 10*3/uL (ref 0.9–3.3)

## 2017-04-26 LAB — FERRITIN: FERRITIN: 21 ng/mL (ref 9–269)

## 2017-04-26 NOTE — Telephone Encounter (Signed)
Scheduled appt per 6/21 los. [facility] to contact patient with ct schedule.

## 2017-04-26 NOTE — Telephone Encounter (Signed)
I think it would be good to touch base with Dr. Gearldine Shown office to determine when they would like another colonoscopy. It looks like she is pancytopenic, not sure when she last had chemotherapy. If she is done and they think she is stable for colonoscopy we can book it. Thanks

## 2017-04-26 NOTE — Telephone Encounter (Signed)
Dr. Havery Moros, do you want to see this patient in office first? You had a recall in system for August colonoscopy. Looks like patient is finished with treatment.

## 2017-04-26 NOTE — Progress Notes (Signed)
  [facility] OFFICE PROGRESS NOTE   Diagnosis:  Anal cancer  INTERVAL HISTORY:   She returns as scheduled. She feels well. Pain with bowel movements continues to improve. No bleeding. Skin has healed. She has a good energy level.  Objective:  Vital signs in last 24 hours:  Blood pressure 129/79, pulse 74, temperature 98.4 F (36.9 C), temperature source Oral, resp. rate 18, height 5\' 2"  (1.575 m), weight 116 lb 6.4 oz (52.8 kg), SpO2 100 %.    HEENT: No thrush or ulcers. Lymphatics: No palpable cervical, supraclavicular, axillary or inguinal lymph nodes. Resp: Lungs clear bilaterally. Cardio: Regular rate and rhythm. GI: Abdomen soft and nontender. No hepatomegaly. Vascular: No leg edema. Skin: Skin hyperpigmentation in the groin regions bilaterally. Radiation skin change at the perianal region, perineum. Skin appears healed.   Lab Results:  Lab Results  Component Value Date   WBC 2.6 (L) 04/26/2017   HGB 9.2 (L) 04/26/2017   HCT 28.8 (L) 04/26/2017   MCV 89.5 04/26/2017   PLT 211 04/26/2017   NEUTROABS 1.4 (L) 04/26/2017    Imaging:  No results found.  Medications: I have reviewed the patient's current medications.  Assessment/Plan: 1. Squamous cell carcinoma the anus  staging CTs 12/28/2016 confirmed an anorectal mass, inguinal/iliac adenopathy  Initiation of radiation and cycle 1 5-FU/mitomycin C on 01/22/2017; radiation completed 03/05/2017  Cycle 2 5-FU/mitomycin C 02/19/2017 2. Rectal pain and bleeding secondary to #1-Improved. 3. Mucositis secondary to chemotherapy. Resolved. 4. Anemia secondary to bleeding related to #1, chemotherapy. Oral iron initiated 03/30/2017. 5. Neutropenia and thrombocytopenia following cycle 1 5-FU/mitomycin C, resolved 6. Left pubic skin infection-Resolved following course of doxycycline; right pubic skin infection 03/30/2017. Course of Keflex prescribed. 7. Diarrhea 03/01/2017-likely secondary to  chemotherapy, doxycycline, and MiraLAX 8. Skin toxicity secondary to radiation.Improved.   Disposition: [name] appears stable. The plan is to obtain a follow-up pelvic CT in early September.   We reviewed today's labs. The anemia appears to be slowly improving. She will continue oral iron.  We made a referral to Dr. Havery Moros to restage the anal cancer at an approximate three-month interval from completion of treatment.  She will return for a follow-up visit here on 07/23/2017. She will contact the office in the interim with any problems.  Plan reviewed with Dr. Benay Spice.      Ned Card ANP/GNP-BC   04/26/2017  1:35 PM

## 2017-04-27 ENCOUNTER — Telehealth: Payer: Self-pay | Admitting: *Deleted

## 2017-04-27 NOTE — Telephone Encounter (Signed)
Okay we can book her for colonoscopy in the next few weeks or whenever she is ready to have it done. She should have a CBC prior to the procedure to ensure normalizing

## 2017-04-27 NOTE — Telephone Encounter (Signed)
Can you check with their office to see if it's okay to book colonoscopy? Not sure if this info was part of the referral. If so, they she can be scheduled for pre-visit and colonoscopy. Thank you.

## 2017-04-27 NOTE — Telephone Encounter (Signed)
[facility] called with "questions in reference to referral received.  Does Ned Card want patient to receive colonoscopy?  Is she stable to be schedule for colonoscopy?"  Verbal order received and read back from Dr. Benay Spice for "Lower surveillance of anal cancer.  If she's never had or needs a colonoscopy, that will be fine to do if it's needed."  Order given to [place] at this time.

## 2017-04-27 NOTE — Telephone Encounter (Signed)
Nurse over at Dr. Gearldine Shown office said, that patient is done with her treatment. All he wants is to have her lower anal canal checked, he said if you want her to have a colonoscopy that she was okay to have that scheduled.

## 2017-04-30 NOTE — Telephone Encounter (Signed)
Had interpreter leave message to call back to office, I have scheduled patient for a pre-visit and repeat colonoscopy.

## 2017-05-01 NOTE — Telephone Encounter (Signed)
Patient has active MyChart, sent message to contact our office re: appointments.

## 2017-05-01 NOTE — Telephone Encounter (Signed)
Left message for Gershon Mussel, patient's son, asking that someone contact our office today as I have some important information re: upcoming appointments.

## 2017-05-03 ENCOUNTER — Other Ambulatory Visit: Payer: Self-pay

## 2017-05-03 DIAGNOSIS — C210 Malignant neoplasm of anus, unspecified: Secondary | ICD-10-CM

## 2017-05-04 NOTE — Progress Notes (Signed)
Called and left message for pt to return call to reschedule PV (was scheduled today at 10am) Angela/PV

## 2017-05-11 ENCOUNTER — Encounter: Payer: BLUE CROSS/BLUE SHIELD | Admitting: Gastroenterology

## 2017-06-08 ENCOUNTER — Encounter: Payer: Self-pay | Admitting: Nurse Practitioner

## 2017-06-12 ENCOUNTER — Other Ambulatory Visit: Payer: Self-pay | Admitting: Nurse Practitioner

## 2017-07-16 ENCOUNTER — Other Ambulatory Visit (HOSPITAL_BASED_OUTPATIENT_CLINIC_OR_DEPARTMENT_OTHER): Payer: BLUE CROSS/BLUE SHIELD

## 2017-07-16 DIAGNOSIS — C210 Malignant neoplasm of anus, unspecified: Secondary | ICD-10-CM | POA: Diagnosis not present

## 2017-07-16 LAB — COMPREHENSIVE METABOLIC PANEL
ALBUMIN: 4.1 g/dL (ref 3.5–5.0)
ALT: 16 U/L (ref 0–55)
ANION GAP: 12 meq/L — AB (ref 3–11)
AST: 25 U/L (ref 5–34)
Alkaline Phosphatase: 61 U/L (ref 40–150)
BUN: 20.6 mg/dL (ref 7.0–26.0)
CO2: 25 meq/L (ref 22–29)
Calcium: 10.2 mg/dL (ref 8.4–10.4)
Chloride: 103 mEq/L (ref 98–109)
Creatinine: 0.9 mg/dL (ref 0.6–1.1)
EGFR: 68 mL/min/{1.73_m2} — AB (ref >90)
Glucose: 80 mg/dl (ref 70–140)
Potassium: 4.7 mEq/L (ref 3.5–5.1)
Sodium: 140 mEq/L (ref 136–145)
TOTAL PROTEIN: 8 g/dL (ref 6.4–8.3)
Total Bilirubin: 0.63 mg/dL (ref 0.20–1.20)

## 2017-07-16 LAB — CBC WITH DIFFERENTIAL/PLATELET
BASO%: 0 % (ref 0.0–2.0)
Basophils Absolute: 0 10*3/uL (ref 0.0–0.1)
EOS ABS: 0 10*3/uL (ref 0.0–0.5)
EOS%: 0.4 % (ref 0.0–7.0)
HEMATOCRIT: 40.9 % (ref 34.8–46.6)
HGB: 13 g/dL (ref 11.6–15.9)
LYMPH#: 0.5 10*3/uL — AB (ref 0.9–3.3)
LYMPH%: 19.5 % (ref 14.0–49.7)
MCH: 29.8 pg (ref 25.1–34.0)
MCHC: 31.8 g/dL (ref 31.5–36.0)
MCV: 93.8 fL (ref 79.5–101.0)
MONO#: 0.1 10*3/uL (ref 0.1–0.9)
MONO%: 4.5 % (ref 0.0–14.0)
NEUT%: 75.6 % (ref 38.4–76.8)
NEUTROS ABS: 2 10*3/uL (ref 1.5–6.5)
PLATELETS: 171 10*3/uL (ref 145–400)
RBC: 4.36 10*6/uL (ref 3.70–5.45)
RDW: 13.2 % (ref 11.2–14.5)
WBC: 2.7 10*3/uL — AB (ref 3.9–10.3)

## 2017-07-23 ENCOUNTER — Ambulatory Visit (HOSPITAL_BASED_OUTPATIENT_CLINIC_OR_DEPARTMENT_OTHER): Payer: BLUE CROSS/BLUE SHIELD | Admitting: Oncology

## 2017-07-23 ENCOUNTER — Telehealth: Payer: Self-pay | Admitting: Oncology

## 2017-07-23 ENCOUNTER — Telehealth: Payer: Self-pay | Admitting: Gastroenterology

## 2017-07-23 VITALS — BP 134/76 | HR 74 | Temp 98.2°F | Resp 20 | Ht 62.0 in | Wt 114.6 lb

## 2017-07-23 DIAGNOSIS — C210 Malignant neoplasm of anus, unspecified: Secondary | ICD-10-CM | POA: Diagnosis not present

## 2017-07-23 NOTE — Telephone Encounter (Signed)
Okay, thanks for letting me know. Agree she needs a follow up exam, hopefully their office can help get ahold of her if we have not had success with the information we have on hand. Thanks

## 2017-07-23 NOTE — Progress Notes (Signed)
[facility] [place] OFFICE PROGRESS NOTE   Diagnosis: Anal cancer  INTERVAL HISTORY:   [name] returns as scheduled. She is here today with her son and an interpreter. She feels well. No difficulty having bowel movements. She has blood when she wipes. The inguinal adenopathy has resolved.  Objective:  Vital signs in last 24 hours:  Blood pressure 134/76, pulse 74, temperature 98.2 F (36.8 C), temperature source Oral, resp. rate 20, height 5\' 2"  (1.575 m), weight 114 lb 9.6 oz (52 kg), SpO2 100 %.    HEENT: Neck without mass Lymphatics: No cervical, supraclavicular, axillary, or inguinal nodes Resp: Lungs clear bilaterally Cardio: Regular rate and rhythm GI: No hepatosplenomegaly Vascular: No leg edema Rectal: There is a stricture at the distal rectum/anal canal, nodularity anteriorly at the anal canal Skin: Radiation hyperpigmentation at the groin and perineum  Lab Results:  Lab Results  Component Value Date   WBC 2.7 (L) 07/16/2017   HGB 13.0 07/16/2017   HCT 40.9 07/16/2017   MCV 93.8 07/16/2017   PLT 171 07/16/2017   NEUTROABS 2.0 07/16/2017    CMP     Component Value Date/Time   NA 140 07/16/2017 0925   K 4.7 07/16/2017 0925   CL 106 08/02/2016 1147   CO2 25 07/16/2017 0925   GLUCOSE 80 07/16/2017 0925   BUN 20.6 07/16/2017 0925   CREATININE 0.9 07/16/2017 0925   CALCIUM 10.2 07/16/2017 0925   PROT 8.0 07/16/2017 0925   ALBUMIN 4.1 07/16/2017 0925   AST 25 07/16/2017 0925   ALT 16 07/16/2017 0925   ALKPHOS 61 07/16/2017 0925   BILITOT 0.63 07/16/2017 0925     Medications: I have reviewed the patient's current medications.  Assessment/Plan: 1. Squamous cell carcinoma the anus  staging CTs 12/28/2016 confirmed an anorectal mass, inguinal/iliac adenopathy  Initiation of radiation and cycle 1 5-FU/mitomycin C on 01/22/2017;radiation completed 03/05/2017  Cycle 2 5-FU/mitomycin C 02/19/2017 2. Rectal pain and bleeding  secondary to #1-Improved. 3. Mucositis secondary to chemotherapy. Resolved. 4. Anemia secondary to bleeding related to #1, chemotherapy.Oral iron initiated 03/30/2017. 5. Neutropenia and thrombocytopenia following cycle 1 5-FU/mitomycin C, resolved 6. Left pubic skin infection-Resolved following course of doxycycline;right pubic skin infection 03/30/2017. Course of Keflex prescribed. 7. Diarrhea 03/01/2017-likely secondary to chemotherapy, doxycycline, and MiraLAX 8. Skin toxicity secondary to radiation. 9. Anal stricture    Disposition:  She has recovered from the chemotherapy/radiation. She appears to have a stricture at the distal rectum/anal canal. The nodularity at the anus most likely represents scarring. I will refer her to Dr. Havery Moros for a surveillance anoscopy with biopsy as indicated.  She will be scheduled for a pelvic CT to follow-up on the pretreatment adenopathy.  She will return for an office visit in 3 months.  15 minutes were spent with the patient today. The majority of time was used for counseling and coordination of care.  Donneta Romberg, MD  07/23/2017  8:32 AM

## 2017-07-23 NOTE — Telephone Encounter (Signed)
Patient is very difficult to contact, she did not return phone calls. Used interpreter services, my chart messages. She had been scheduled for pre-visit along with procedure and no showed for pre-visit. Nurse in PV cancelled procedure. Received another request from Dr. Gearldine Shown office today, letter sent to his office on 8/3, re: unable to contact patient. Asked our front office to contact them to see if there is another phone number to reach patient.

## 2017-07-23 NOTE — Telephone Encounter (Signed)
Gave patient/relative avs report and appointments for December. [place] will call re GI appointment. Relative given information to f/u with [facility] to schedule scan.

## 2017-07-27 ENCOUNTER — Telehealth: Payer: Self-pay | Admitting: *Deleted

## 2017-07-27 NOTE — Telephone Encounter (Signed)
Left message for pt's daughter to call office. Pt needs CT scan. Needs to call radiology to schedule.

## 2017-08-02 ENCOUNTER — Encounter: Payer: Self-pay | Admitting: Oncology

## 2017-08-10 NOTE — Telephone Encounter (Addendum)
Attempted to reach pt to have CT scheduled, no answer.  Left voicemail at number listed for son and daughter to call office or call radiology to schedule scan.

## 2017-08-20 ENCOUNTER — Telehealth: Payer: Self-pay | Admitting: Emergency Medicine

## 2017-08-20 NOTE — Telephone Encounter (Signed)
Spoke with patients daughter regarding Dr.Armbrusters office trying to reach out to her but never being able to connect with pt via telephone or e-mail. Also regarding CT scan orders for [facility], [facility]. Numbers given to [place] (pts daughter) for [facility] and [facility]. Pts daughter states she will be calling both places to set up appts needed. Daughter also wants her email attached to any future emails sent to patient as she does not understand english. Daughters email is [email or web address]

## 2017-09-11 ENCOUNTER — Encounter: Payer: Self-pay | Admitting: Nurse Practitioner

## 2017-10-01 ENCOUNTER — Other Ambulatory Visit: Payer: Self-pay | Admitting: *Deleted

## 2017-10-01 DIAGNOSIS — C210 Malignant neoplasm of anus, unspecified: Secondary | ICD-10-CM

## 2017-10-03 ENCOUNTER — Telehealth: Payer: Self-pay | Admitting: Nurse Practitioner

## 2017-10-03 NOTE — Telephone Encounter (Signed)
Left message for patient regarding appt that was added per 11/28 sch msg.

## 2017-10-08 ENCOUNTER — Other Ambulatory Visit (HOSPITAL_BASED_OUTPATIENT_CLINIC_OR_DEPARTMENT_OTHER): Payer: BLUE CROSS/BLUE SHIELD

## 2017-10-08 ENCOUNTER — Ambulatory Visit (HOSPITAL_COMMUNITY)
Admission: RE | Admit: 2017-10-08 | Discharge: 2017-10-08 | Disposition: A | Discharge status: Home / Self Care | Payer: BLUE CROSS/BLUE SHIELD | Source: Ambulatory Visit | Attending: Nurse Practitioner | Admitting: Nurse Practitioner

## 2017-10-08 DIAGNOSIS — C210 Malignant neoplasm of anus, unspecified: Secondary | ICD-10-CM

## 2017-10-08 DIAGNOSIS — C787 Secondary malignant neoplasm of liver and intrahepatic bile duct: Secondary | ICD-10-CM | POA: Insufficient documentation

## 2017-10-08 LAB — BASIC METABOLIC PANEL
Anion Gap: 7 mEq/L (ref 3–11)
BUN: 18.8 mg/dL (ref 7.0–26.0)
CHLORIDE: 108 meq/L (ref 98–109)
CO2: 27 mEq/L (ref 22–29)
Calcium: 9.4 mg/dL (ref 8.4–10.4)
Creatinine: 0.9 mg/dL (ref 0.6–1.1)
EGFR: >60 mL/min/{1.73_m2} (ref >60)
GLUCOSE: 76 mg/dL (ref 70–140)
POTASSIUM: 4.7 meq/L (ref 3.5–5.1)
Sodium: 142 mEq/L (ref 136–145)

## 2017-10-08 MED ORDER — IOPAMIDOL (ISOVUE-300) INJECTION 61%
INTRAVENOUS | Status: AC
  Administered 2017-10-08: 100 mL
  Filled 2017-10-08: qty 100

## 2017-10-22 ENCOUNTER — Telehealth: Payer: Self-pay | Admitting: Nurse Practitioner

## 2017-10-22 ENCOUNTER — Encounter: Payer: Self-pay | Admitting: Nurse Practitioner

## 2017-10-22 ENCOUNTER — Ambulatory Visit (HOSPITAL_BASED_OUTPATIENT_CLINIC_OR_DEPARTMENT_OTHER): Payer: BLUE CROSS/BLUE SHIELD | Admitting: Nurse Practitioner

## 2017-10-22 ENCOUNTER — Telehealth: Payer: Self-pay

## 2017-10-22 VITALS — BP 140/77 | HR 86 | Temp 98.0°F | Resp 18 | Ht 62.0 in | Wt 121.7 lb

## 2017-10-22 DIAGNOSIS — K769 Liver disease, unspecified: Secondary | ICD-10-CM

## 2017-10-22 DIAGNOSIS — R599 Enlarged lymph nodes, unspecified: Secondary | ICD-10-CM

## 2017-10-22 DIAGNOSIS — C210 Malignant neoplasm of anus, unspecified: Secondary | ICD-10-CM | POA: Diagnosis not present

## 2017-10-22 NOTE — Telephone Encounter (Signed)
Called and left message for son, Gershon Mussel, asking that patient call [facility] to schedule anascopy.

## 2017-10-22 NOTE — Progress Notes (Addendum)
  [place] OFFICE PROGRESS NOTE   Diagnosis: Anal cancer  INTERVAL HISTORY:   Anna Lawrence returns as scheduled.  She has occasional pain and bleeding with bowel movements.  She reports a good appetite.  Objective:  Vital signs in last 24 hours:  Blood pressure 140/77, pulse 86, temperature 98 F (36.7 C), temperature source Oral, resp. rate 18, height 5\' 2"  (1.575 m), weight 121 lb 11.2 oz (55.2 kg), SpO2 100 %.    HEENT: Neck without mass. Lymphatics: No palpable cervical, supraclavicular, axillary or inguinal lymph nodes. Resp: Lungs clear bilaterally. Cardio: Regular rate and rhythm. GI: Abdomen soft and nontender. No hepatomegaly. Stricture at the distal rectum/anal canal.  Nodularity anterior anal canal. Vascular: No leg edema.  Skin: Radiation hyperpigmentation at the perineum.       Lab Results:  Lab Results  Component Value Date   WBC 2.7 (L) 07/16/2017   HGB 13.0 07/16/2017   HCT 40.9 07/16/2017   MCV 93.8 07/16/2017   PLT 171 07/16/2017   NEUTROABS 2.0 07/16/2017    Imaging:  No results found.  Medications: I have reviewed the patient's current medications.  Assessment/Plan: 1. Squamous cell carcinoma the anus  staging CTs 12/28/2016 confirmed an anorectal mass, inguinal/iliac adenopathy  Initiation of radiation and cycle 1 5-FU/mitomycin C on 01/22/2017;radiation completed 03/05/2017  Cycle 2 5-FU/mitomycin C 02/19/2017  CT abdomen/pelvis 10/08/2017-lesion in the dome of the liver has decreased in size currently measuring 5 mm, previously 1.5 cm; 7 mm lesion in the posterior right hepatic lobe unchanged.  Left inguinal lymph node measures 1.5 x 1.8 cm, previously 2.6 x 3.2 cm. 2. Rectal pain and bleeding secondary to #1-Improved. 3. Mucositis secondary to chemotherapy. Resolved. 4. Anemia secondary to bleeding related to #1, chemotherapy.Oral iron initiated 03/30/2017. 5. Neutropenia and thrombocytopenia following cycle 1  5-FU/mitomycin C, resolved 6. Left pubic skin infection-Resolved following course of doxycycline;right pubic skin infection 03/30/2017. Course of Keflex prescribed. 7. Diarrhea 03/01/2017-likely secondary to chemotherapy, doxycycline, and MiraLAX 8. 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 The restaging CT scan shows further improvement in left inguinal adenopathy. A liver lesion is also smaller. On previous imaging the liver lesion was felt to likely be a cyst. Her case will be presented at the upcoming GI conference for radiology review.   She continues to have nodularity at the anterior anal canal. She was previously referred to Dr. Havery Moros for a surveillance anoscopy with biopsy as indicated. They are having difficulty contacting her to arrange for this. We will ask the GI nurse navigator to assist with coordination of this.   She will return for a follow-up visit in 3 months.   Patient seen with Dr. Benay Spice.  Ned Card ANP/GNP-BC   10/22/2017  9:34 AM This was a shared visit with Ned Card.  Anna Lawrence is in clinical remission from anal cancer.  We will refer her to Dr. Havery Moros for surveillance of the anus/rectum.  I suspect the liver lesion mentioned as a possible metastasis on the current CT is a benign finding.  This lesion was felt to be benign on initial staging evaluation.  We will present her case at the GI tumor conference for further review of the imaging studies.  Anna Manson, MD

## 2017-10-22 NOTE — Telephone Encounter (Signed)
Scheduled appt per 12/17 los - gave patient AVS and calender per los.   

## 2017-11-09 ENCOUNTER — Ambulatory Visit: Payer: BLUE CROSS/BLUE SHIELD | Admitting: Gastroenterology

## 2018-01-14 ENCOUNTER — Ambulatory Visit: Payer: BLUE CROSS/BLUE SHIELD | Admitting: Physician Assistant

## 2018-01-14 ENCOUNTER — Other Ambulatory Visit: Payer: Self-pay

## 2018-01-14 ENCOUNTER — Encounter: Payer: Self-pay | Admitting: Physician Assistant

## 2018-01-14 VITALS — BP 124/82 | HR 86 | Temp 97.7°F | Resp 16 | Ht 62.0 in | Wt 120.0 lb

## 2018-01-14 DIAGNOSIS — B029 Zoster without complications: Secondary | ICD-10-CM

## 2018-01-14 MED ORDER — GABAPENTIN 100 MG PO CAPS: 100.0000 mg | ORAL_CAPSULE | Freq: Two times a day (BID) | ORAL | 0 refills | Status: DC

## 2018-01-14 MED ORDER — OXYCODONE-ACETAMINOPHEN 5-325 MG PO TABS: 1.0000 | ORAL_TABLET | Freq: Three times a day (TID) | ORAL | 0 refills | Status: DC | PRN

## 2018-01-14 NOTE — Progress Notes (Signed)
    Patient presents to clinic today c/o painful blistering rash of left abdomen starting on Thursday. Endorses spreading over the weekend, wrapping around her side to the spine. Denies rash of R side. Notes rash is very painful and sometimes itching. Did take her Percocet which helped but made her constipated.  Denies fever, chills, malaise or fatigue.   Past Medical History:  Diagnosis Date  . Chicken pox   . 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 naproxen sodium (ANAPROX) 220 MG tablet Take 220 mg by mouth as needed.    . Omega-3 Fatty Acids (FISH OIL PO) Take by mouth. Take 1 capsule by mouth 2 times daily.    Marland Kitchen oxyCODONE-acetaminophen (PERCOCET/ROXICET) 5-325 MG tablet Take 1-2 tablets by mouth every 6 (six) hours as needed for severe pain. (Patient not taking: Reported on 01/14/2018) 40 tablet 0   No current facility-administered medications on file prior to visit.     No Known Allergies  Family History  Problem Relation Age of Onset  . Diabetes Mother   . Hypertension Mother     Social History   Socioeconomic History  . Marital status: Married    Spouse name: None  . Number of children: None  . Years of education: None  . Highest education level: None  Social Needs  . Financial resource strain: None  . Food insecurity - worry: None  . Food insecurity - inability: None  . Transportation needs - medical: None  . Transportation needs - non-medical: None  Occupational History  . None  Tobacco Use  . Smoking status: Never Smoker  . Smokeless tobacco: Never Used  Substance and Sexual Activity  . Alcohol use: No    Alcohol/week: 0.0 oz  . Drug use: No  . Sexual activity: Not Currently  Other Topics Concern  . None  Social History Narrative  . None   Review of Systems - See HPI.  All other ROS are negative.  BP 124/82   Pulse 86   Temp 97.7 F (36.5 C) (Oral)    Resp 16   Ht 5\' 2"  (1.575 m)   Wt 120 lb (54.4 kg)   SpO2 99%   BMI 21.95 kg/m   Physical Exam  Constitutional: She is oriented to person, place, and time and well-developed, well-nourished, and in no distress.  HENT:  Head: Normocephalic and atraumatic.  Cardiovascular: Normal rate, regular rhythm, normal heart sounds and intact distal pulses.  Pulmonary/Chest: Effort normal and breath sounds normal. No respiratory distress. She has no wheezes. She has no rales. She exhibits no tenderness.  Neurological: She is alert and oriented to person, place, and time.  Skin: Skin is warm and dry.     Psychiatric: Affect normal.  Vitals reviewed.  Assessment/Plan: 1. Herpes zoster without complication Unfortunately patient has had symptoms for 5 days no so we are outside the window for antivirals. Will start her on Gabapentin 100 mg BID. Continue Percocet. Refill given. Topical lidocaine recommended. Will have close follow-up to further adjust medications. Discussed transmission of the virus to those who have no history of chicken pox. She is to keep the area covered.    Leeanne Rio, PA-C

## 2018-01-14 NOTE — Patient Instructions (Signed)
Please start the Gabapentin twice daily for nerve pain. Take the Percocet as directed when needed for severe pain. OTC topical lidocaine will also help.  While taking the Percocet, please take a daily Miralax and increase water intake.  Follow-up with me on Friday for repeat assessment.   Keep the rash covered until all blisters are scabbed over.  Avoid contact with the elderly, very young and pregnant mothers.

## 2018-01-17 NOTE — Progress Notes (Addendum)
Anna Lawrence 67 y.o. 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      Pain:2/10 left side taking Oxycodone Shingles since last Thursday being treated with neurontin. Nausea/ Vomiting:No Diarrhea:No Skin irritation:No Vaginal/Rectal bleeding:No Fatigue:Yes a little bit Loss of appetite:No Weight: Wt Readings from Last 3 Encounters:  01/22/18 122 lb 6.4 oz (55.5 kg)  01/21/18 123 lb 3.2 oz (55.9 kg)  01/18/18 120 lb 6 oz (54.6 kg)  BP 136/74 (BP Location: Right Arm, Patient Position: Sitting, Cuff Size: Normal)   Pulse 80   Temp 98 F (36.7 C) (Oral)   Resp 18   Ht 5\' 2"  (1.575 m)   Wt 122 lb 6.4 oz (55.5 kg)   SpO2 100%   BMI 22.39 kg/m

## 2018-01-18 ENCOUNTER — Ambulatory Visit: Payer: BLUE CROSS/BLUE SHIELD | Admitting: Physician Assistant

## 2018-01-18 ENCOUNTER — Encounter: Payer: Self-pay | Admitting: Physician Assistant

## 2018-01-18 VITALS — BP 98/60 | HR 74 | Temp 97.8°F | Resp 16 | Ht 62.0 in | Wt 120.4 lb

## 2018-01-18 DIAGNOSIS — B029 Zoster without complications: Secondary | ICD-10-CM

## 2018-01-18 NOTE — Patient Instructions (Signed)
Please increase Gabapentin to the following doses.  Take 200 mg each morning, 200 mg each early afternoon and 100 mg at night.  Continue percocet as directed when needed for severe pain.  Do not take more than as directed.  Apply topical Lidocaine to the area.  Keep covered. We will follow-up in 1 more week to make further adjustments.   I encourage you to increase hydration and the amount of fiber in your diet.  Start a daily probiotic (Align, Culturelle, Digestive Advantage, etc.). If no bowel movement within 24 hours, take 2 Tbs of Milk of Magnesia in a 4 oz glass of warmed prune juice every 2-3 days to help promote bowel movement. If no results within 24 hours, then repeat above regimen, adding a Dulcolax stool softener to regimen. If this does not promote a bowel movement, please call the office.

## 2018-01-18 NOTE — Progress Notes (Signed)
   Patient presents to clinic today for 1 week follow-up of shingles and neuropathic pain associated with this. At last visit, patient started on Percocet and Gabapentin 100 mg BID.  Is taking as directed and tolerating well. Notes constipation that is alleviated with Miralax. Son notes she has taking too much of this. Has since stopped.  Past Medical History:  Diagnosis Date  . Chicken pox   . 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 gabapentin (NEURONTIN) 100 MG capsule Take 1 capsule (100 mg total) by mouth 2 (two) times daily. 60 capsule 0  . naproxen sodium (ANAPROX) 220 MG tablet Take 220 mg by mouth as needed.    . Omega-3 Fatty Acids (FISH OIL PO) Take by mouth. Take 1 capsule by mouth 2 times daily.    Marland Kitchen oxyCODONE-acetaminophen (PERCOCET/ROXICET) 5-325 MG tablet Take 1-2 tablets by mouth every 8 (eight) hours as needed for severe pain. 15 tablet 0   No current facility-administered medications on file prior to visit.     No Known Allergies  Family History  Problem Relation Age of Onset  . Diabetes Mother   . Hypertension Mother     Social History   Socioeconomic History  . Marital status: Married    Spouse name: None  . Number of children: None  . Years of education: None  . Highest education level: None  Social Needs  . Financial resource strain: None  . Food insecurity - worry: None  . Food insecurity - inability: None  . Transportation needs - medical: None  . Transportation needs - non-medical: None  Occupational History  . None  Tobacco Use  . Smoking status: Never Smoker  . Smokeless tobacco: Never Used  Substance and Sexual Activity  . Alcohol use: No    Alcohol/week: 0.0 oz  . Drug use: No  . Sexual activity: Not Currently  Other Topics Concern  . None  Social History Narrative  . None   Review of Systems - See HPI.  All other ROS are negative.  BP  98/60 (BP Location: Left Arm, Patient Position: Sitting, Cuff Size: Normal)   Pulse 74   Temp 97.8 F (36.6 C) (Oral)   Resp 16   Ht 5\' 2"  (1.575 m)   Wt 120 lb 6 oz (54.6 kg)   SpO2 99%   BMI 22.02 kg/m   Physical Exam  Constitutional: She is oriented to person, place, and time and well-developed, well-nourished, and in no distress.  HENT:  Head: Normocephalic and atraumatic.  Eyes: Conjunctivae are normal.  Cardiovascular: Normal rate, regular rhythm, normal heart sounds and intact distal pulses.  Pulmonary/Chest: Effort normal and breath sounds normal. No respiratory distress. She has no wheezes. She has no rales. She exhibits no tenderness.  Neurological: She is alert and oriented to person, place, and time.  Skin: Skin is warm and dry.     Psychiatric: Affect normal.  Vitals reviewed.   Assessment/Plan: 1. Herpes zoster without complication Scabbing over nicely. Tolerating Gabapentin well. Increase 200 mg AM, noon and 100 mg PM. Continue Percocet. Follow-up 1 week.    Leeanne Rio, PA-C

## 2018-01-21 ENCOUNTER — Inpatient Hospital Stay: Payer: BLUE CROSS/BLUE SHIELD | Attending: Oncology | Admitting: Oncology

## 2018-01-21 ENCOUNTER — Telehealth: Payer: Self-pay | Admitting: Oncology

## 2018-01-21 VITALS — BP 121/71 | HR 73 | Temp 97.7°F | Resp 17 | Ht 62.0 in | Wt 123.2 lb

## 2018-01-21 DIAGNOSIS — Z923 Personal history of irradiation: Secondary | ICD-10-CM | POA: Insufficient documentation

## 2018-01-21 DIAGNOSIS — C210 Malignant neoplasm of anus, unspecified: Secondary | ICD-10-CM | POA: Diagnosis present

## 2018-01-21 DIAGNOSIS — Z9221 Personal history of antineoplastic chemotherapy: Secondary | ICD-10-CM | POA: Insufficient documentation

## 2018-01-21 DIAGNOSIS — B029 Zoster without complications: Secondary | ICD-10-CM | POA: Insufficient documentation

## 2018-01-21 NOTE — Progress Notes (Signed)
  Oncology Nurse Navigator Documentation  Navigator Location: [facility] (01/21/18 2585)   )Navigator Encounter Type: Follow-up Appt (01/21/18 2778)                         Barriers/Navigation Needs: Coordination of Care (01/21/18 2423)   Interventions: Coordination of Care (01/21/18 5361) Called [facility] and obtained appointment for patient to come in for office visit on 02/01/18 @ 9 AM to discuss next steps. Interpreter present to communicate this information. I also reviewed phone number listed in chart for her and her son Gershon Mussel. The number listed is correct.   Patient verbalized understanding of time/date/location of [facility] appointment.            Acuity: Level 1 (01/21/18 4431)         Time Spent with Patient: 15 (01/21/18 5400)

## 2018-01-21 NOTE — Progress Notes (Signed)
  [place] OFFICE PROGRESS NOTE   Diagnosis: Anal cancer  INTERVAL HISTORY:   [name] returns as scheduled.  She was diagnosed with a zoster rash at the left abdomen last week.  The rash is painful.  She is taking gabapentin and Percocet for pain.  The rash has improved.  Good appetite.  No rectal pain.  Objective:  Vital signs in last 24 hours:  Blood pressure 121/71, pulse 73, temperature 97.7 F (36.5 C), temperature source Oral, resp. rate 17, height 5\' 2"  (1.575 m), weight 123 lb 3.2 oz (55.9 kg), SpO2 100 %.    HEENT: Neck without mass Lymphatics: No cervical, supraclavicular, axillary, or inguinal nodes Resp: Lungs clear bilaterally Cardio: Regular rate and rhythm GI: No hepatomegaly, no mass, nontender Vascular: No leg edema  Skin: Crusted rash at the left mid abdomen and posterior lateral chest  Medications: I have reviewed the patient's current medications.   Assessment/Plan: 1. Squamous cell carcinoma the anus  staging CTs 12/28/2016 confirmed an anorectal mass, inguinal/iliac adenopathy  Initiation of radiation and cycle 1 5-FU/mitomycin C on 01/22/2017;radiation completed 03/05/2017  Cycle 2 5-FU/mitomycin C 02/19/2017  CT abdomen/pelvis 10/08/2017-lesion in the dome of the liver has decreased in size currently measuring 5 mm, previously 1.5 cm; 7 mm lesion in the posterior right hepatic lobe unchanged.  Left inguinal lymph node measures 1.5 x 1.8 cm, previously 2.6 x 3.2 cm. 2. Rectal pain and bleeding secondary to #1-Improved. 3. Mucositis secondary to chemotherapy. Resolved. 4. Anemia secondary to bleeding related to #1, chemotherapy.Oral iron initiated 03/30/2017. 5. Neutropenia and thrombocytopenia following cycle 1 5-FU/mitomycin C, resolved 6. Left pubic skin infection-Resolved following course of doxycycline;right pubic skin infection 03/30/2017. Course of Keflex prescribed. 7. Diarrhea 03/01/2017-likely secondary to  chemotherapy, doxycycline, and MiraLAX 8. 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 Zoster rash left abdomen/back March 2019      Disposition: She is in clinical remission from anal cancer.  She is scheduled to see radiation oncology tomorrow.  She will be scheduled for an appointment with Dr. Havery Moros to discuss a surveillance sigmoidoscopy.  She will return for an office visit in 6 months. 15 minutes were spent with the patient today.  The majority of the time was used for counseling and coordination of care.  Betsy Coder, MD  01/21/2018  9:00 AM

## 2018-01-21 NOTE — Telephone Encounter (Signed)
Gave patient avs and calendar with appts per 3/18 los.  °

## 2018-01-22 ENCOUNTER — Ambulatory Visit
Admission: RE | Admit: 2018-01-22 | Discharge: 2018-01-22 | Disposition: A | Discharge status: Home / Self Care | Payer: BLUE CROSS/BLUE SHIELD | Source: Ambulatory Visit | Attending: Radiation Oncology | Admitting: Radiation Oncology

## 2018-01-22 ENCOUNTER — Encounter: Payer: Self-pay | Admitting: Radiation Oncology

## 2018-01-22 ENCOUNTER — Other Ambulatory Visit: Payer: Self-pay

## 2018-01-22 VITALS — BP 136/74 | HR 80 | Temp 98.0°F | Resp 18 | Ht 62.0 in | Wt 122.4 lb

## 2018-01-22 DIAGNOSIS — R590 Localized enlarged lymph nodes: Secondary | ICD-10-CM | POA: Insufficient documentation

## 2018-01-22 DIAGNOSIS — L818 Other specified disorders of pigmentation: Secondary | ICD-10-CM | POA: Diagnosis not present

## 2018-01-22 DIAGNOSIS — B029 Zoster without complications: Secondary | ICD-10-CM | POA: Diagnosis not present

## 2018-01-22 DIAGNOSIS — Z79899 Other long term (current) drug therapy: Secondary | ICD-10-CM | POA: Diagnosis not present

## 2018-01-22 DIAGNOSIS — C210 Malignant neoplasm of anus, unspecified: Secondary | ICD-10-CM | POA: Diagnosis present

## 2018-01-22 DIAGNOSIS — Z1239 Encounter for other screening for malignant neoplasm of breast: Secondary | ICD-10-CM

## 2018-01-22 NOTE — Progress Notes (Signed)
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      Interval Since Last Radiation:  11 months   01/22/17-03/05/17: Anus treated to 54 Gy in 30 fractions  Narrative:  [name] is a pleasant 67 y.o. female with a history of squamous cell carcinoma of the anus. She had been experiencing intermittent rectal bleeding and discomfort in the anus and underwent colonoscopy on 12/25/16 revealing a rectal mass extending into the perineum, and was partially obstructing, though the scope could reach the transverse colon. 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 She has undergone metastatic work up and a CT chest/abd/pelvis was performed on 12/28/16 and this revealed a 4.8 x 4.2 cm mass like thickening of the distal rectum above the anal verge, a 6 mm right mesorectal lymph node. She also had multiple enlarged left inguinal adenopathy measuring up to 3.2 cm. There was a 1.5 cm external illiac, and 1.1 cm left internal iliac node. No other disease was noted.  Of note she has never had an abnormal pap smear, her last was normal on 01/10/17. She comes today for surveillance follow up. She saw Dr. Learta Codding yesterday, and her last anoscopy with Dr. Johney Maine was about 6 months ago.                    On review of systems, the patient reports that she is doing well overall. She denies any chest pain, shortness of breath, cough, fevers, chills, night sweats, unintended weight changes. She denies any issues with lower extremity edema. She has had some thin caliber of her stools and denies using her vaginal dilators or being sexually active since she last was seen in our office. She denies any vaginal bleeding, or bladder disturbances, and denies  abdominal pain, nausea or vomiting. She was recently diagnosed with shingles in her left thorax and has had some pain that is being managed with gabapentin. She denies any new musculoskeletal or joint aches or pains, new skin lesions or concerns. A complete review of systems is obtained and is otherwise negative.  Past Medical History:  Past Medical History:  Diagnosis Date  . Chicken pox   . 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 IR FLUORO GUIDE CV LINE RIGHT  02/19/2017  . IR GENERIC HISTORICAL  01/22/2017   IR US GUIDE VASC ACCESS RIGHT 01/22/2017 Corrie Mckusick, DO [facility]  . IR GENERIC HISTORICAL  01/22/2017   IR FLUORO GUIDE CV LINE RIGHT 01/22/2017 Corrie Mckusick, DO [facility]  . IR US GUIDE VASC ACCESS RIGHT  02/19/2017  . TUBAL LIGATION      Social History:  Social History   Socioeconomic History  . Marital status: Married    Spouse name: Not on file  . Number of children: Not on file  . Years of education: Not on file  . Highest education level: Not on file  Social Needs  . Financial resource strain: Not on file  . Food insecurity - worry: Not on file  . Food insecurity - inability: Not on file  . Transportation needs - medical: Not on file  . Transportation needs - non-medical:  Not on file  Occupational History  . Not on file  Tobacco Use  . Smoking status: Never Smoker  . Smokeless tobacco: Never Used  Substance and Sexual Activity  . Alcohol use: No    Alcohol/week: 0.0 oz  . Drug use: No  . Sexual activity: Not Currently  Other Topics Concern  . Not on file  Social History Narrative  . Not on file  The patient is married and resides in [place]. She is accompanied by her adult son. 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     Family History: Family History  Problem Relation Age of Onset  . Diabetes Mother   . Hypertension Mother       ALLERGIES:  has No Known Allergies.  Meds: Current Outpatient Medications  Medication Sig Dispense Refill  . gabapentin (NEURONTIN) 100 MG capsule Take 1 capsule (100 mg total) by mouth 2 (two) times daily. 60 capsule 0  . naproxen sodium (ANAPROX) 220 MG tablet Take 220 mg by mouth as needed.    . Omega-3 Fatty Acids (FISH OIL PO) Take by mouth. Take 1 capsule by mouth 2 times daily.    Marland Kitchen oxyCODONE-acetaminophen (PERCOCET/ROXICET) 5-325 MG tablet Take 1-2 tablets by mouth every 8 (eight) hours as needed for severe pain. 15 tablet 0   No current facility-administered medications for this encounter.     Physical Findings:  height is 5\' 2"  (1.575 m) and weight is 122 lb 6.4 oz (55.5 kg). Her oral temperature is 98 F (36.7 C). Her blood pressure is 136/74 and her pulse is 80. Her respiration is 18 and oxygen saturation is 100%.  Pain Assessment Pain Score: 2  Pain Loc: Abdomen(Left side)/10 In general this is a well appearing Asian female in no acute distress. He is alert and oriented x4 and appropriate throughout the examination. HEENT reveals that the patient is normocephalic, atraumatic. EOMs are intact. PERRLA. Skin is intact without any evidence of gross lesions. Cardiovascular exam reveals a regular rate and rhythm, no clicks rubs or murmurs are auscultated. Chest is clear to auscultation bilaterally. Lymphatic assessment is performed and does not reveal any adenopathy in the cervical, supraclavicular, axillary, or inguinal chains. Abdomen has active bowel sounds in all quadrants and is intact. The abdomen is soft, non tender, non distended. Lower extremities are negative for pretibial pitting edema, deep calf tenderness, cyanosis or clubbing.Bilateral breast exam is negative for palpable masses, nipple discharge or bleeding. Pelvic exam reveals normal appearing external female genitalia. Only mild hyperpigmentation is noted of the groins and perineal region. No desquamation was noted.  BUS is intact without lesion.The labial and perineal and perianal tissue is intact without desquamation or lesion. No visible tumor is seen. Speculum exam reveals a normal appearing cervix. Bimanual exam reveals circumferential scarring but moreso on the right proximal vagina and this is manually lysed without difficulty. Rectovaginal exam reveals circumferential induration and scarring about 4 cm from the anal verge consistent with rectal stenosis, allowing only one fingertip to pass. No bleeding is noted on the examination glove.  Lab Findings: Lab Results  Component Value Date   WBC 2.7 (L) 07/16/2017   HGB 13.0 07/16/2017   HCT 40.9 07/16/2017   MCV 93.8 07/16/2017   PLT 171 07/16/2017     Radiographic Findings: No results found.  Impression/Plan: 1. 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 The patient appears to be clinically NED. She will return to see Dr.  Sherrill in 6 month's time. I will plan to see her in one year's time, and she should continue seeing Dr. Johney Maine in May 2019 for anoscopy. She will call us with questions or concerns regarding her care. 2. Prevention of vaginal stenosis. The patient is given dilators and lubricant to use at home and instructed on this. She will also be referred to meet with PT regarding this.  3. Breast Cancer Screening. The patient also needs to have mammogram. We will order this as well and follow up with the results when available.     Carola Rhine, PAC

## 2018-01-22 NOTE — Addendum Note (Signed)
Encounter addended by: Malena Edman, RN on: 01/22/2018 9:40 AM  Actions taken: Charge Capture section accepted

## 2018-01-24 NOTE — Addendum Note (Signed)
Encounter addended by: Malena Edman, RN on: 01/24/2018 9:51 AM  Actions taken: Pend clinical note, Delete clinical note, Sign clinical note

## 2018-01-28 ENCOUNTER — Ambulatory Visit: Payer: BLUE CROSS/BLUE SHIELD | Admitting: Physician Assistant

## 2018-01-28 DIAGNOSIS — Z0289 Encounter for other administrative examinations: Secondary | ICD-10-CM

## 2018-02-01 ENCOUNTER — Encounter: Payer: Self-pay | Admitting: Gastroenterology

## 2018-02-01 ENCOUNTER — Ambulatory Visit: Payer: BLUE CROSS/BLUE SHIELD | Admitting: Gastroenterology

## 2018-02-01 VITALS — BP 122/60 | HR 76 | Ht 62.0 in | Wt 123.2 lb

## 2018-02-01 DIAGNOSIS — C210 Malignant neoplasm of anus, unspecified: Secondary | ICD-10-CM | POA: Diagnosis not present

## 2018-02-01 NOTE — Patient Instructions (Signed)
You have been scheduled for a flexible sigmoidoscopy. Please follow the written instructions given to you at your visit today. If you use inhalers (even only as needed), please bring them with you on the day of your procedure.  

## 2018-02-01 NOTE — Progress Notes (Signed)
     02/01/2018 Lima Sundby [id number] 1951-01-28   HISTORY OF PRESENT ILLNESS:  This is a [place] female who is known to Dr. Havery Moros for colonoscopy to evaluate an anal mass.  She was diagnosed with squamous cell carcinoma of the anus in 12/2016.  Was treated with chemo and radiation.  Dr. Benay Spice is sendin her back for follow-up sigmoidoscopy.  Patient is doing well.  Does not have any complaints.  Moves her bowels well.  No rectal bleeding.  Patient speaks [place] so her son provided translation today.   Past Medical History:  Diagnosis Date  . Anal cancer ([place])   . Chicken pox    Past Surgical History:  Procedure Laterality Date  . IR FLUORO GUIDE CV LINE RIGHT  02/19/2017  . IR GENERIC HISTORICAL  01/22/2017   IR US GUIDE VASC ACCESS RIGHT 01/22/2017 Corrie Mckusick, DO [facility]  . IR GENERIC HISTORICAL  01/22/2017   IR FLUORO GUIDE CV LINE RIGHT 01/22/2017 Corrie Mckusick, DO [facility]  . IR US GUIDE VASC ACCESS RIGHT  02/19/2017  . TUBAL LIGATION      reports that she has never smoked. She has never used smokeless tobacco. She reports that she does not drink alcohol or use drugs. family history includes Diabetes in her mother; Hypertension in her mother. No Known Allergies    Outpatient Encounter Medications as of 02/01/2018  Medication Sig  . gabapentin (NEURONTIN) 100 MG capsule Take 1 capsule (100 mg total) by mouth 2 (two) times daily.  . naproxen sodium (ANAPROX) 220 MG tablet Take 220 mg by mouth as needed.  . Omega-3 Fatty Acids (FISH OIL PO) Take by mouth. Take 1 capsule by mouth 2 times daily.  Marland Kitchen oxyCODONE-acetaminophen (PERCOCET/ROXICET) 5-325 MG tablet Take 1-2 tablets by mouth every 8 (eight) hours as needed for severe pain.   No facility-administered encounter medications on file as of 02/01/2018.      REVIEW OF SYSTEMS  : All other systems reviewed and negative except where noted in the History of Present Illness.   PHYSICAL EXAM: BP 122/60   Pulse  76   Ht 5\' 2"  (1.575 m)   Wt 123 lb 3.2 oz (55.9 kg)   SpO2 98%   BMI 22.53 kg/m  General: Well developed Asian female in no acute distress Head: Normocephalic and atraumatic Eyes:  Sclerae anicteric, conjunctiva pink. Ears: Normal auditory acuity Lungs: Clear throughout to auscultation;no W/R/R. Heart: Regular rate and rhythm; no M/R/G. Abdomen: Soft, non-distended.  BS present.  Non-tender. Rectal:  Will be done at the time of flex sig. Musculoskeletal: Symmetrical with no gross deformities  Skin: No lesions on visible extremities Extremities: No edema  Neurological: Alert oriented x 4, grossly non-focal Psychological:  Alert and cooperative. Normal mood and affect  ASSESSMENT AND PLAN: *History of squamous cell carcinoma of the anus diagnosed 12/2016:  Treated with radiation and chemo.  Dr. Benay Spice would like a repeat sigmoidoscopy for follow-up.  Patient feeling well with no symptoms.  Will schedule with Dr. Havery Moros.  **The risks, benefits, and alternatives to EGD were discussed with the patient and she consents to proceed.   CC:  [name], PA-C

## 2018-02-03 NOTE — Progress Notes (Signed)
Agree with assessment and plan as outlined. I recommend full colonoscopy however - her last exam was limited to use of only upper endoscope (which was needed to traverse rectal lesion) and her entire colon was not evaluated. Jess can you please change her exam to full colonoscopy and she will need to be counseled on bowel prep, etc. Thanks

## 2018-02-05 NOTE — Progress Notes (Signed)
Left message on son's voicemail to call office.

## 2018-02-06 ENCOUNTER — Encounter: Payer: Self-pay | Admitting: Physician Assistant

## 2018-02-06 MED ORDER — NA SULFATE-K SULFATE-MG SULF 17.5-3.13-1.6 GM/177ML PO SOLN: 1.0000 | ORAL | 0 refills | Status: DC

## 2018-02-06 NOTE — Addendum Note (Signed)
Addended by: Wyline Beady on: 02/06/2018 03:12 PM   Modules accepted: Orders

## 2018-02-06 NOTE — Progress Notes (Signed)
Spoke to patients daughter and informed him that we need to change the procedure to a colonoscopy. Went over instructions briefly and mailed them to patient as well. Son verbalized understanding.

## 2018-02-07 ENCOUNTER — Other Ambulatory Visit: Payer: Self-pay | Admitting: Physician Assistant

## 2018-02-07 MED ORDER — OXYCODONE-ACETAMINOPHEN 5-325 MG PO TABS: 1.0000 | ORAL_TABLET | Freq: Three times a day (TID) | ORAL | 0 refills | Status: DC | PRN

## 2018-02-25 ENCOUNTER — Encounter: Payer: Self-pay | Admitting: Gastroenterology

## 2018-02-28 ENCOUNTER — Telehealth: Payer: Self-pay

## 2018-02-28 NOTE — Telephone Encounter (Signed)
Left message for son to call back, we need to change the time of the procedure from 0800 to 10:00. Need to review new prep instructions and new arrival time. Asked that he request to speak directly to me.

## 2018-03-01 ENCOUNTER — Telehealth: Payer: Self-pay

## 2018-03-01 NOTE — Telephone Encounter (Signed)
Spoke to patient's son, Gershon Mussel, he is aware of change of time for procedure next Friday, 5/3. New time is 10:00 arrive at 09:00. Verbally given new instructions about prepping. He had no further questions.

## 2018-03-06 ENCOUNTER — Telehealth: Payer: Self-pay

## 2018-03-06 NOTE — Telephone Encounter (Signed)
-----   Message from Irven Baltimore sent at 03/06/2018  4:25 PM EDT ----- Patient son is requesting to speak to you again.

## 2018-03-06 NOTE — Telephone Encounter (Signed)
Patient's son said the pharmacy was waiting on a prior authorization for the prep. Explained that we cannot do prior authorizations for Suprep. We do have a sample here and will put it at the front desk. Reminded him that patient needs to be on clear liquids tomorrow all day, and have placed the prep instructions in envelop for him to pick up. Made corrections to time of procedure as it is now at 10:00 am.

## 2018-03-08 ENCOUNTER — Encounter: Payer: Self-pay | Admitting: Gastroenterology

## 2018-03-08 ENCOUNTER — Other Ambulatory Visit: Payer: Self-pay

## 2018-03-08 ENCOUNTER — Ambulatory Visit (AMBULATORY_SURGERY_CENTER): Payer: BLUE CROSS/BLUE SHIELD | Admitting: Gastroenterology

## 2018-03-08 ENCOUNTER — Encounter: Payer: BLUE CROSS/BLUE SHIELD | Admitting: Gastroenterology

## 2018-03-08 VITALS — BP 125/71 | HR 75 | Temp 98.7°F | Resp 18 | Ht 62.0 in | Wt 123.0 lb

## 2018-03-08 DIAGNOSIS — C210 Malignant neoplasm of anus, unspecified: Secondary | ICD-10-CM

## 2018-03-08 DIAGNOSIS — K624 Stenosis of anus and rectum: Secondary | ICD-10-CM | POA: Diagnosis not present

## 2018-03-08 DIAGNOSIS — Z85048 Personal history of other malignant neoplasm of rectum, rectosigmoid junction, and anus: Secondary | ICD-10-CM

## 2018-03-08 MED ORDER — SODIUM CHLORIDE 0.9 % IV SOLN: 500.0000 mL | Freq: Once | INTRAVENOUS | Status: DC

## 2018-03-08 NOTE — Patient Instructions (Signed)
Per Dr Havery Moros, take Miralax 1 capful daily to keep stool loose. Handout on diverticulosis given to her son.  YOU HAD AN ENDOSCOPIC PROCEDURE TODAY AT [facility]:   Refer to the procedure report that was given to you for any specific questions about what was found during the examination.  If the procedure report does not answer your questions, please call your gastroenterologist to clarify.  If you requested that your care partner not be given the details of your procedure findings, then the procedure report has been included in a sealed envelope for you to review at your convenience later.  YOU SHOULD EXPECT: Some feelings of bloating in the abdomen. Passage of more gas than usual.  Walking can help get rid of the air that was put into your GI tract during the procedure and reduce the bloating. If you had a lower endoscopy (such as a colonoscopy or flexible sigmoidoscopy) you may notice spotting of blood in your stool or on the toilet paper. If you underwent a bowel prep for your procedure, you may not have a normal bowel movement for a few days.  Please Note:  You might notice some irritation and congestion in your nose or some drainage.  This is from the oxygen used during your procedure.  There is no need for concern and it should clear up in a day or so.  SYMPTOMS TO REPORT IMMEDIATELY:   Following lower endoscopy (colonoscopy or flexible sigmoidoscopy):  Excessive amounts of blood in the stool  Significant tenderness or worsening of abdominal pains  Swelling of the abdomen that is new, acute  Fever of 100F or higher   For urgent or emergent issues, a gastroenterologist can be reached at any hour by calling [phone].   DIET:  We do recommend a small meal at first, but then you may proceed to your regular diet.  Drink plenty of fluids but you should avoid alcoholic beverages for 24 hours.  ACTIVITY:  You should plan to take it easy for the rest of today and you  should NOT DRIVE or use heavy machinery until tomorrow (because of the sedation medicines used during the test).    FOLLOW UP: Our staff will call the number listed on your records the next business day following your procedure to check on you and address any questions or concerns that you may have regarding the information given to you following your procedure. If we do not reach you, we will leave a message.  However, if you are feeling well and you are not experiencing any problems, there is no need to return our call.  We will assume that you have returned to your regular daily activities without incident.  If any biopsies were taken you will be contacted by phone or by letter within the next 1-3 weeks.  Please call us at [phone] if you have not heard about the biopsies in 3 weeks.    SIGNATURES/CONFIDENTIALITY: You and/or your care partner have signed paperwork which will be entered into your electronic medical record.  These signatures attest to the fact that that the information above on your After Visit Summary has been reviewed and is understood.  Full responsibility of the confidentiality of this discharge information lies with you and/or your care-partner.

## 2018-03-08 NOTE — Progress Notes (Signed)
Assisted pt in dressing.  Pt became dizzy and pale.  Put back on stretcher until stable enough to ride out in wheelchair. Anna Lawrence/recovery room

## 2018-03-08 NOTE — Progress Notes (Signed)
A and O x3. Report to RN. Tolerated MAC anesthesia well.

## 2018-03-08 NOTE — Progress Notes (Signed)
Interpreter Simmaly Milpitas present, language [place]

## 2018-03-08 NOTE — Op Note (Signed)
[facility] Patient Name: Anna Lawrence Procedure Date: 03/08/2018 10:06 AM MRN: [id number] Endoscopist: Remo Lipps P. Demont Linford MD, MD Age: 67 Referring MD:  Date of Birth: August 18, 1951 Gender: Female Account #: [id number] Procedure:                Colonoscopy Indications:              Personal history of malignant neoplasm of the anal                            canal s/p treatment with radiation and chemotherapy Medicines:                Monitored Anesthesia Care Procedure:                Pre-Anesthesia Assessment:                           - Prior to the procedure, a History and Physical                            was performed, and patient medications and                            allergies were reviewed. The patient's tolerance of                            previous anesthesia was also reviewed. The risks                            and benefits of the procedure and the sedation                            options and risks were discussed with the patient.                            All questions were answered, and informed consent                            was obtained. Prior Anticoagulants: The patient has                            taken no previous anticoagulant or antiplatelet                            agents. ASA Grade Assessment: II - A patient with                            mild systemic disease. After reviewing the risks                            and benefits, the patient was deemed in                            satisfactory condition to undergo the procedure.  After obtaining informed consent, the colonoscope                            was passed under direct vision. Throughout the                            procedure, the patient's blood pressure, pulse, and                            oxygen saturations were monitored continuously. The                            Colonoscope was introduced through the anus and      advanced to the the cecum, identified by                            appendiceal orifice and ileocecal valve. The                            colonoscopy was performed without difficulty. The                            patient tolerated the procedure well. The quality                            of the bowel preparation was good. The ileocecal                            valve, appendiceal orifice, and rectum were                            photographed. Scope In: 10:08:37 AM Scope Out: 10:23:19 AM Scope Withdrawal Time: 0 hours 11 minutes 37 seconds  Total Procedure Duration: 0 hours 14 minutes 42 seconds  Findings:                 The digital rectal exam findings include anal                            stricture, could not pass the examination finger by                            it.                           A tattoo was seen in the distal rectum.                           A benign-appearing, intrinsic moderate stenosis                            measuring less than one cm (in length) was found at                            the anus, likely due to radiation. No  evidence of                            residual malignant tissue noted.                           A few medium-mouthed diverticula were found in the                            sigmoid colon and ascending colon.                           The exam was otherwise without abnormality.                            Retroflexed views not obtained due to small size of                            rectum and restricted mobility in this area due to                            stricturing. Complications:            No immediate complications. Estimated blood loss:                            None. Estimated Blood Loss:     Estimated blood loss: none. 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                           - A tattoo was seen in the distal rectum.                           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                           - Diverticulosis in the sigmoid colon and in the                            ascending colon.                           - The examination was otherwise normal.                           - No polyps Recommendation:           - Patient has a contact number available for                            emergencies. The signs and symptoms of potential  delayed complications were discussed with the                            patient. Return to normal activities tomorrow.                            Written discharge instructions were provided to the                            patient.                           - Resume previous diet.                           - Continue present medications.                           - Repeat colonoscopy in 3 years for surveillance. Remo Lipps P. Stace Peace MD, MD 03/08/2018 09:38:18 AM This report has been signed electronically.

## 2018-03-11 ENCOUNTER — Telehealth: Payer: Self-pay

## 2018-03-11 NOTE — Telephone Encounter (Signed)
NO ANSWER, MESSAGE LEFT FOR PATIENT. 

## 2018-03-11 NOTE — Telephone Encounter (Signed)
No answer on follow up call x2

## 2018-04-22 IMAGING — PT NM PET TUM IMG INITIAL (PI) SKULL BASE T - THIGH
8 series · 25 of 25 positions shown · non-contrast
Comparison: 12/28/2016

CLINICAL DATA: Initial treatment strategy for anal cancer.

EXAM:
NUCLEAR MEDICINE PET SKULL BASE TO THIGH
TECHNIQUE: 7.1 mCi F-18 FDG was injected intravenously. Full-ring PET imaging
was performed from the skull base to thigh after the radiotracer. CT
data was obtained and used for attenuation correction and anatomic
localization.
FASTING BLOOD GLUCOSE:  Value: 85 mg/dl

[Series 3: pet sk_thigh ac · axial · 5.0mm · 4.07mm/px · z∈[-752,+48]mm · 4 of 201 slices shown]
[im 1/201]
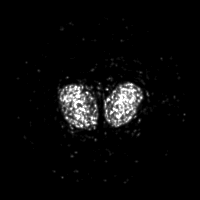
[im 67/201]
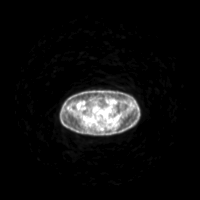
[im 134/201]
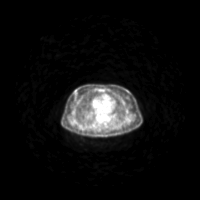
[im 201/201]
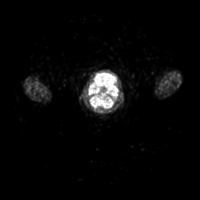

[Series 4: ct sk_thigh 5.0 b31f · axial · 5.0mm · 0.98mm/px · z∈[-752,+48]mm · 5 of 201 slices shown]
[im 1/201]
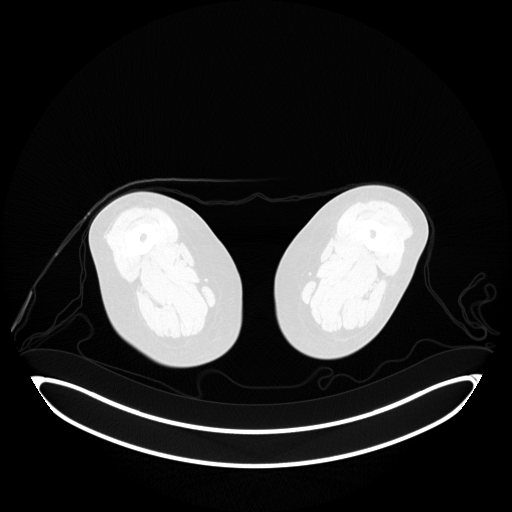
[im 51/201]
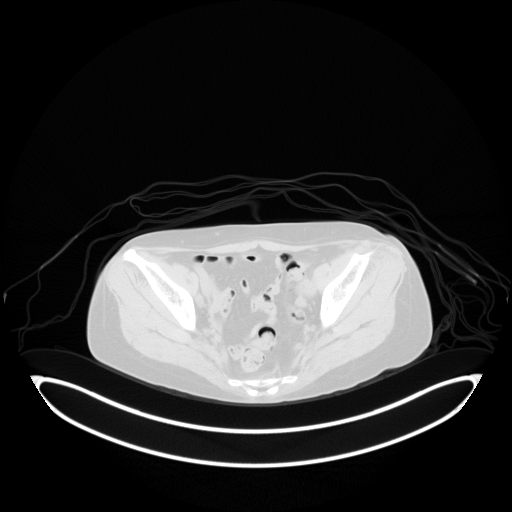
[im 101/201]
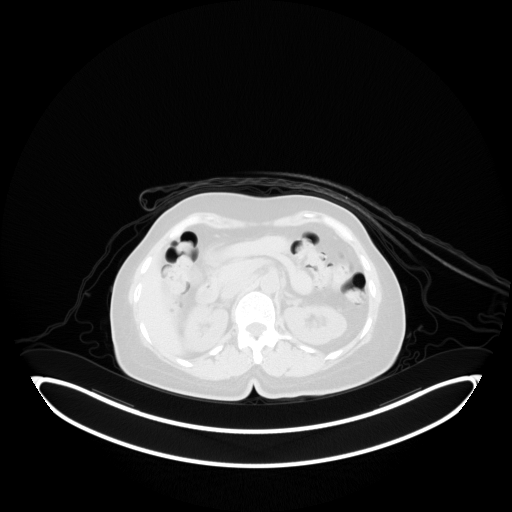
[im 151/201]
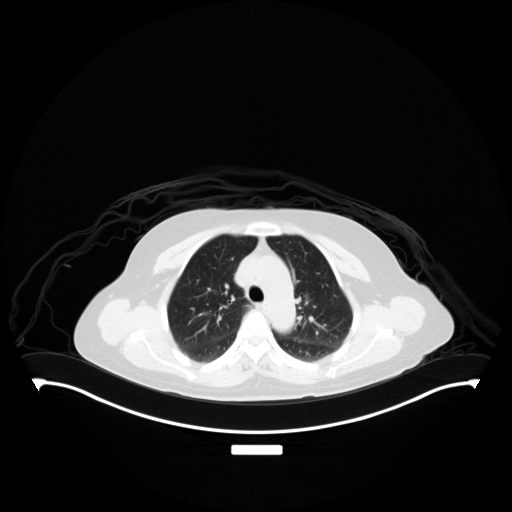
[im 201/201  brain]
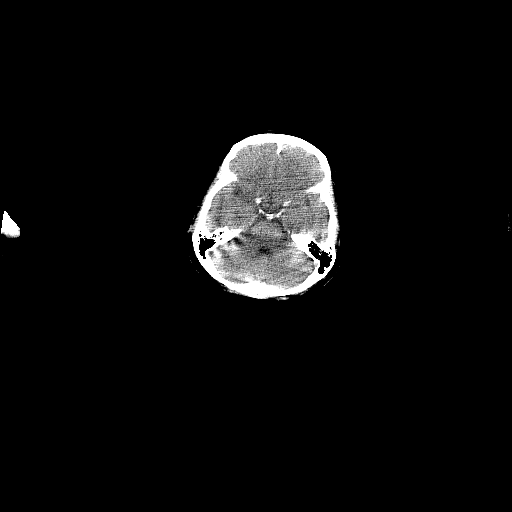

[Series 7: pet sk_thigh nac · axial · 5.0mm · 4.07mm/px · z∈[-752,+48]mm · 5 of 201 slices shown]
[im 1/201]
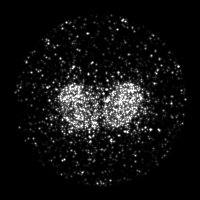
[im 51/201]
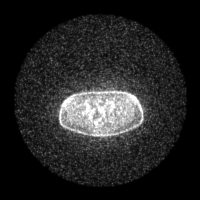
[im 101/201]
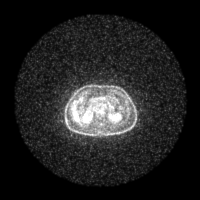
[im 151/201]
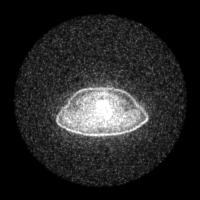
[im 201/201]
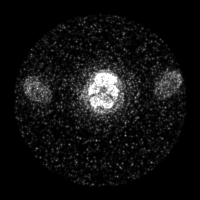

[Series 8: ct sk_thigh 5.0 b70f (id)_bone · axial · 5.0mm · 0.55mm/px · z∈[-316,-60]mm · 2 of 65 slices shown]
[im 1/65  bone]
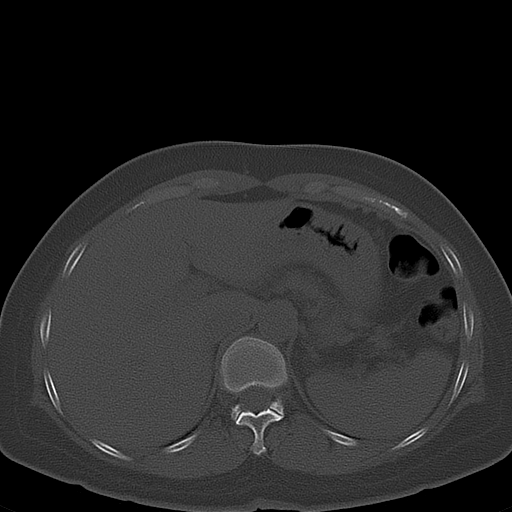
[im 65/65  bone]
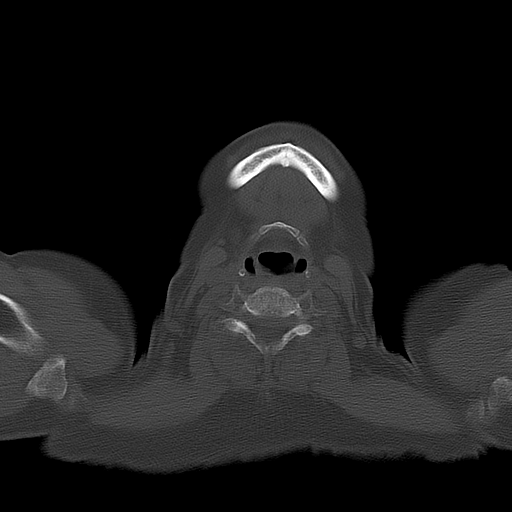

[Series 604: range-ct sk_thigh 5.0 (id)<alpha range> · 2 of 77 slices shown (1 of 2)]
[im 1/77]
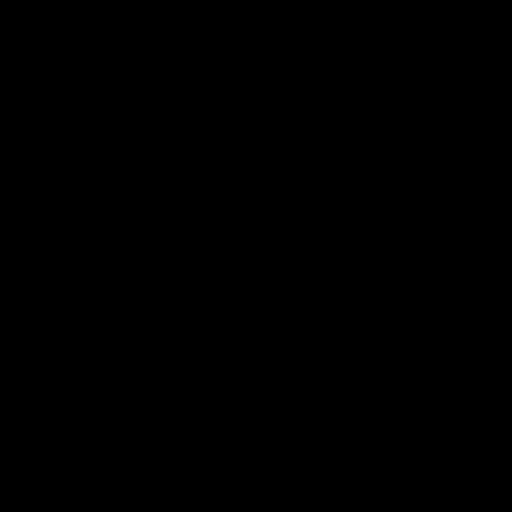
[im 77/77]
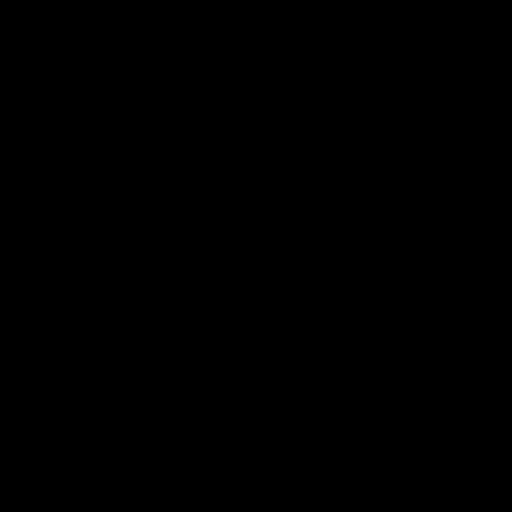

[Series 605: mip collection · coronal · 1.68mm/px · 1 of 32 slices shown]
[im 1/32]
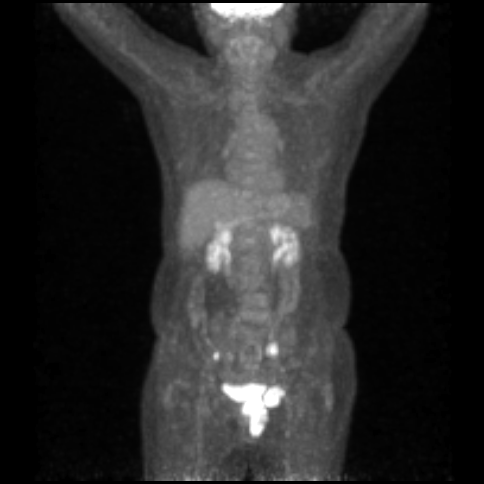

[Series 606: range-ct sk_thigh 5.0 (id)<alpha range> · 5 of 185 slices shown (2 of 2)]
[im 1/185]
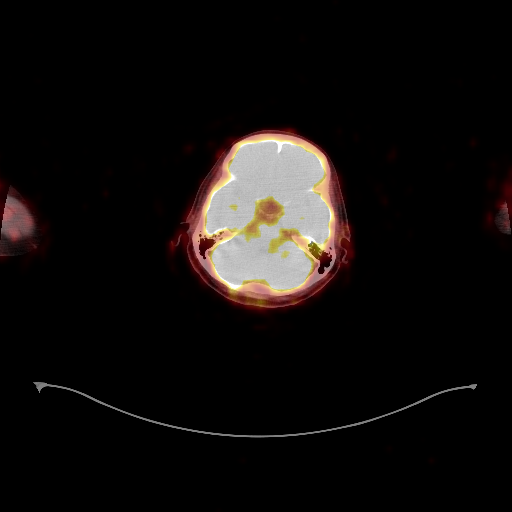
[im 47/185]
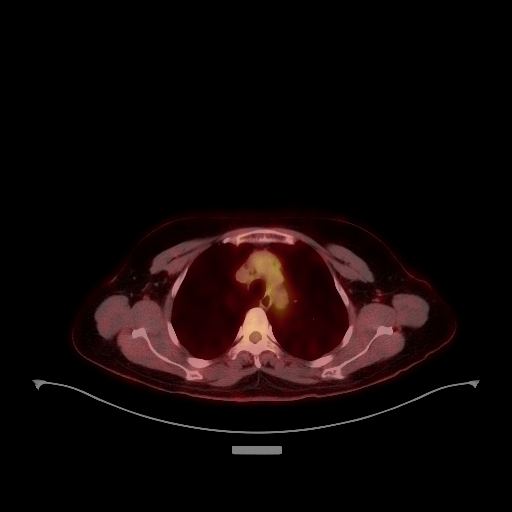
[im 93/185]
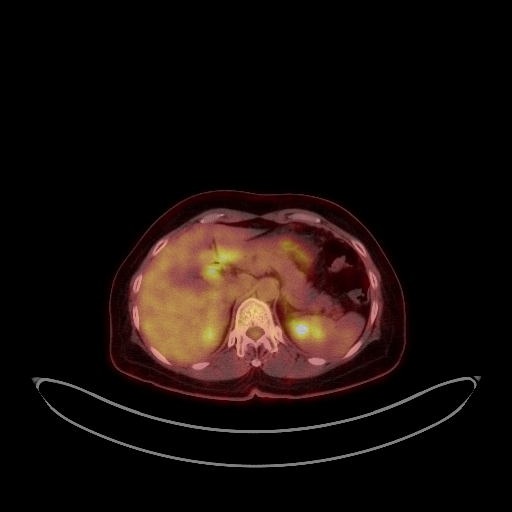
[im 139/185]
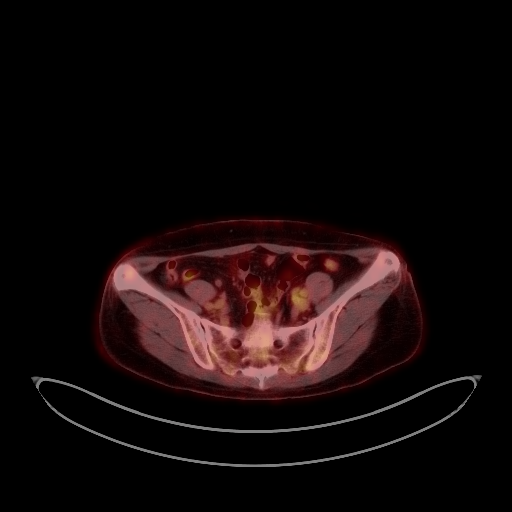
[im 185/185]
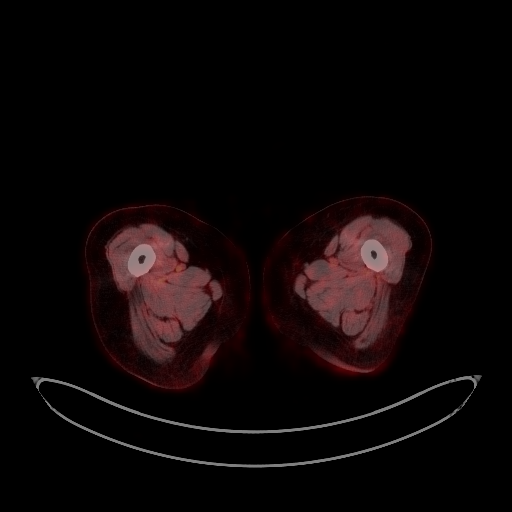

[Series 1032: results mm oncology reading · 5.0mm · 0.68mm/px · 1 of 5 slices shown]
[im 1/5]
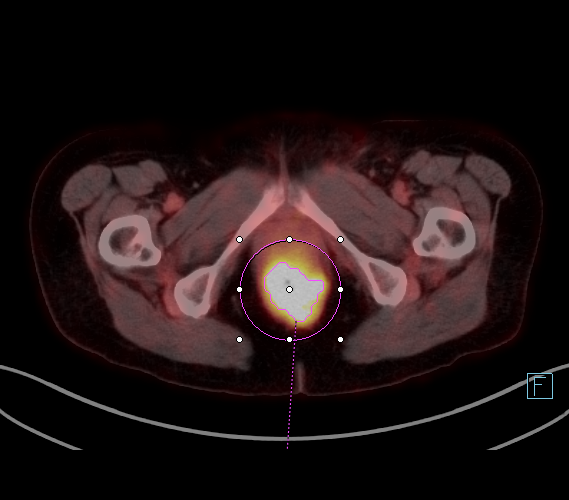

[25 of 25 positions shown; findings below may reference images not displayed]

FINDINGS: NECK

No hypermetabolic lymph nodes in the neck.

CHEST

No hypermetabolic mediastinal or hilar nodes. No suspicious
pulmonary nodules on the CT data.

ABDOMEN/PELVIS

No abnormal hypermetabolic activity within the liver, pancreas,
adrenal glands, or spleen. The small previously seen hypervascular
lesion in segment 6 of the liver does not demonstrate high metabolic
activity at this time.

There is 1.4 by 1.1 cm hypodense lesion anteriorly in segment 4a of
the liver, image 78/4, fluid density and photopenic, probably a
cyst.

Large in somewhat protruding anorectal mass, maximum SUV 17.5.

Left inguinal lymph node, 2.6 cm in short axis, maximum SUV 14.4.

Left external iliac node 1.5 cm in short axis on image 148/4,
maximum SUV 12.5.

Right external iliac node 0.9 cm in short axis on image 150/4,
maximum SUV 8.5. Left internal iliac lymph node 1.1 cm in short axis
on image 156/4, maximum SUV 2.8. Mild stranding and nodularity in
the perirectal space and presacral space likely due to local nodal
spread.

SKELETON

No focal hypermetabolic activity to suggest skeletal metastasis.
IMPRESSION: 1. Large anorectal mass with perirectal nodularity and stranding, as
well as pathologic and hypermetabolic adenopathy in the left
inguinal, left external iliac, right external iliac, and left
internal iliac chains.

## 2018-04-27 IMAGING — US IR FLUORO GUIDE CV LINE*R*
1 series · 1 of 1 positions shown · non-contrast
Comparison: none

INDICATION: 65-year-old female with a history of anal cancer. Referred for
evaluation for PICC placement

[Series 1: ir fluoro/shunt/fist · 1 of 1 slices shown]
[im 1/1]
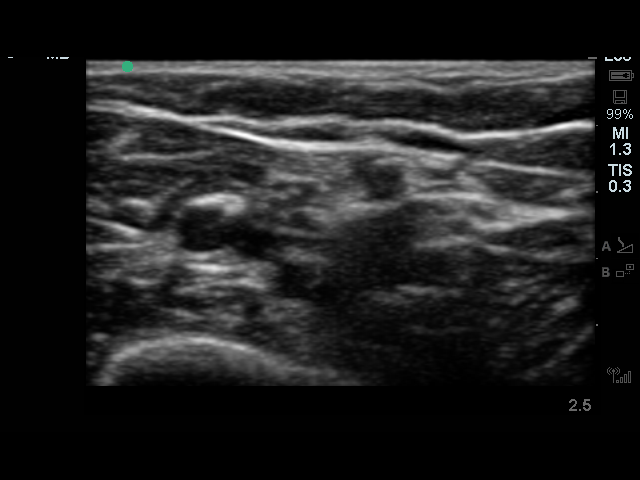

[1 of 1 positions shown; findings below may reference images not displayed]

EXAM:
Right PICC LINE PLACEMENT WITH ULTRASOUND AND FLUOROSCOPIC GUIDANCE

MEDICATIONS:
None

ANESTHESIA/SEDATION:
None

FLUOROSCOPY TIME:  Fluoroscopy Time: 0 minutes 6 seconds (0.3 mGy).

COMPLICATIONS:
None

PROCEDURE:
The patient was advised of the possible risks and complications and
agreed to undergo the procedure. The patient was then brought to the
angiographic suite for the procedure.

The right arm was prepped with chlorhexidine, draped in the usual
sterile fashion using maximum barrier technique (cap and mask,
sterile gown, sterile gloves, large sterile sheet, hand hygiene and
cutaneous antisepsis) and infiltrated locally with 1% Lidocaine.

Ultrasound demonstrated patency of the right basilic vein, and this
was documented with an image. Under real-time ultrasound guidance,
this vein was accessed with a 21 gauge micropuncture needle and
image documentation was performed. A [DATE] wire was introduced in to
the vein. Over this, a 5 French single lumen power injectable PICC
was advanced to the lower SVC/right atrial junction. Fluoroscopy
during the procedure and fluoro spot radiograph confirms appropriate
catheter position. The catheter was flushed and covered with
asterile dressing.

Catheter length: 38 cm
IMPRESSION: Status post right basilic vein power injectable PICC measuring 38
cm. Catheter ready for use.

## 2018-05-06 ENCOUNTER — Ambulatory Visit: Payer: BLUE CROSS/BLUE SHIELD | Admitting: Physician Assistant

## 2018-05-06 DIAGNOSIS — Z0289 Encounter for other administrative examinations: Secondary | ICD-10-CM

## 2018-05-25 IMAGING — US IR US GUIDE VASC ACCESS RIGHT
1 series · 1 of 1 positions shown · IV contrast (agent unspecified)
Comparison: none

INDICATION: Patient with poor venous access. Request is made for peripherally
inserted central catheter.

EXAM:
ULTRASOUND AND FLUOROSCOPIC GUIDED PICC LINE INSERTION
MEDICATIONS:
3 mL 1% lidocaine
CONTRAST:  3 mL
FLUOROSCOPY TIME:  60 seconds
COMPLICATIONS:
None immediate.
TECHNIQUE: The procedure, risks, benefits, and alternatives were explained to
the patient and informed written consent was obtained. A timeout was
performed prior to the initiation of the procedure.

[Series 1: ir fluoro/shunt/fist · 1 of 1 slices shown]
[im 1/1]
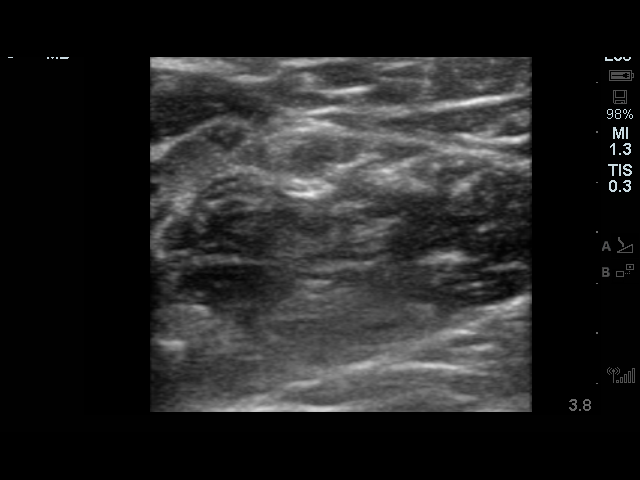

[1 of 1 positions shown; findings below may reference images not displayed]

The right upper extremity was prepped with chlorhexidine in a
sterile fashion, and a sterile drape was applied covering the
operative field. Maximum barrier sterile technique with sterile
gowns and gloves were used for the procedure. A timeout was
performed prior to the initiation of the procedure. Local anesthesia
was provided with 1% lidocaine.

Under direct ultrasound guidance, the right basilic vein was
accessed with a micropuncture kit after the overlying soft tissues
were anesthetized with 1% lidocaine. An ultrasound image was saved
for documentation purposes. A guidewire was advanced to the level of
the superior caval-atrial junction for measurement purposes and the
PICC line was cut to length. Catheter could not be advanced
initially, so 3 mL of Omnipaque were injected and venography
performed which reveal catheter in good position. A peel-away sheath
was placed and a 39 cm, 5 French, dual lumen was inserted to level
of the superior caval-atrial junction. A post procedure spot
fluoroscopic was obtained. The catheter easily aspirated and flushed
and was secured in place. A dressing was placed. The patient
tolerated the procedure well without immediate post procedural
complication.
FINDINGS: After catheter placement, the tip lies within the superior
cavoatrial junction. The catheter aspirates and flushes normally and
is ready for immediate use.
IMPRESSION: Successful ultrasound and fluoroscopic guided placement of a right
basilic vein approach, 39 cm, 5 French, dual lumen PICC with tip at
the superior caval-atrial junction. The PICC line is ready for
immediate use.

## 2018-07-05 ENCOUNTER — Encounter: Payer: Self-pay | Admitting: Physician Assistant

## 2018-07-05 ENCOUNTER — Other Ambulatory Visit: Payer: Self-pay

## 2018-07-05 ENCOUNTER — Ambulatory Visit: Payer: BLUE CROSS/BLUE SHIELD | Admitting: Physician Assistant

## 2018-07-05 VITALS — BP 110/76 | HR 69 | Temp 97.5°F | Resp 15 | Ht 61.25 in | Wt 125.2 lb

## 2018-07-05 DIAGNOSIS — Z0000 Encounter for general adult medical examination without abnormal findings: Secondary | ICD-10-CM

## 2018-07-05 DIAGNOSIS — M25472 Effusion, left ankle: Secondary | ICD-10-CM

## 2018-07-05 DIAGNOSIS — G2581 Restless legs syndrome: Secondary | ICD-10-CM | POA: Diagnosis not present

## 2018-07-05 DIAGNOSIS — Z23 Encounter for immunization: Secondary | ICD-10-CM | POA: Diagnosis not present

## 2018-07-05 LAB — COMPREHENSIVE METABOLIC PANEL
ALBUMIN: 4.3 g/dL (ref 3.5–5.2)
ALT: 10 U/L (ref 0–35)
AST: 15 U/L (ref 0–37)
Alkaline Phosphatase: 46 U/L (ref 39–117)
BUN: 17 mg/dL (ref 6–23)
CALCIUM: 9.4 mg/dL (ref 8.4–10.5)
CHLORIDE: 104 meq/L (ref 96–112)
CO2: 31 mEq/L (ref 19–32)
CREATININE: 1.02 mg/dL (ref 0.40–1.20)
GFR: 57.65 mL/min — ABNORMAL LOW (ref >60.00)
Glucose, Bld: 80 mg/dL (ref 70–99)
Potassium: 4.6 mEq/L (ref 3.5–5.1)
SODIUM: 141 meq/L (ref 135–145)
Total Bilirubin: 0.6 mg/dL (ref 0.2–1.2)
Total Protein: 7.2 g/dL (ref 6.0–8.3)

## 2018-07-05 LAB — CBC WITH DIFFERENTIAL/PLATELET
BASOS PCT: 0.5 % (ref 0.0–3.0)
Basophils Absolute: 0 10*3/uL (ref 0.0–0.1)
EOS ABS: 0 10*3/uL (ref 0.0–0.7)
Eosinophils Relative: 2.1 % (ref 0.0–5.0)
HEMATOCRIT: 36.4 % (ref 36.0–46.0)
HEMOGLOBIN: 12 g/dL (ref 12.0–15.0)
Lymphocytes Relative: 38 % (ref 12.0–46.0)
Lymphs Abs: 0.8 10*3/uL (ref 0.7–4.0)
MCHC: 32.9 g/dL (ref 30.0–36.0)
MCV: 93.9 fl (ref 78.0–100.0)
MONO ABS: 0.2 10*3/uL (ref 0.1–1.0)
Monocytes Relative: 9 % (ref 3.0–12.0)
Neutro Abs: 1 10*3/uL — ABNORMAL LOW (ref 1.4–7.7)
Neutrophils Relative %: 50.4 % (ref 43.0–77.0)
Platelets: 191 10*3/uL (ref 150.0–400.0)
RBC: 3.88 Mil/uL (ref 3.87–5.11)
RDW: 13.6 % (ref 11.5–15.5)
WBC: 2.1 10*3/uL — ABNORMAL LOW (ref 4.0–10.5)

## 2018-07-05 LAB — LIPID PANEL
CHOLESTEROL: 200 mg/dL (ref 0–200)
HDL: 57.5 mg/dL (ref >39.00)
LDL CALC: 120 mg/dL — AB (ref 0–99)
NonHDL: 142.94
TRIGLYCERIDES: 117 mg/dL (ref 0.0–149.0)
Total CHOL/HDL Ratio: 3
VLDL: 23.4 mg/dL (ref 0.0–40.0)

## 2018-07-05 LAB — IRON: Iron: 86 ug/dL (ref 42–145)

## 2018-07-05 LAB — HEMOGLOBIN A1C: HEMOGLOBIN A1C: 5.7 % (ref 4.6–6.5)

## 2018-07-05 NOTE — Progress Notes (Signed)
Translator present.  Patient presents to clinic today for annual exam.  Patient is fasting for labs. Is keeping a well-balanced diet and trying to keep active. No routine medications. Is doing very well overall. Has noted some mild intermittent in medial L ankle over the past couple of weeks. Denies trauma or injury. Denies redness or tenderness. Occasionally notes a crawling pain in her calves bilaterally in the night. Denies prior history of RLS symptoms.  Health Maintenance: Immunizations -- Flu and Prevnar due. Agrees to have today. Colonoscopy -- up-to-date. Followed by GI and Oncology -- rectal carcinoma Mammogram -- Overdue. Previously ordered but not completed. They agree to screening now.  PAP -- Up-to-date. Followed by GYN. Bone Density -- Overdue. Previously ordered but not done. They agree to screening now.  Past Medical History:  Diagnosis Date  . Anal cancer ([place])   . Chicken pox     Past Surgical History:  Procedure Laterality Date  . IR FLUORO GUIDE CV LINE RIGHT  02/19/2017  . IR GENERIC HISTORICAL  01/22/2017   IR US GUIDE VASC ACCESS RIGHT 01/22/2017 Corrie Mckusick, DO [facility]  . IR GENERIC HISTORICAL  01/22/2017   IR FLUORO GUIDE CV LINE RIGHT 01/22/2017 Corrie Mckusick, DO [facility]  . IR US GUIDE VASC ACCESS RIGHT  02/19/2017  . TUBAL LIGATION      Current Outpatient Medications on File Prior to Visit  Medication Sig Dispense Refill  . naproxen sodium (ANAPROX) 220 MG tablet Take 220 mg by mouth as needed.    . Omega-3 Fatty Acids (FISH OIL PO) Take by mouth. Take 1 capsule by mouth 2 times daily.     Current Facility-Administered Medications on File Prior to Visit  Medication Dose Route Frequency Provider Last Rate Last Dose  . 0.9 %  sodium chloride infusion  500 mL Intravenous Once Armbruster, Carlota Raspberry, MD        No Known Allergies  Family History  Problem Relation Age of Onset  . Diabetes Mother   . Hypertension Mother   . Colon polyps Neg Hx     . Rectal cancer Neg Hx     Social History   Socioeconomic History  . Marital status: Married    Spouse name: Not on file  . Number of children: Not on file  . Years of education: Not on file  . Highest education level: Not on file  Occupational History  . Not on file  Social Needs  . Financial resource strain: Not on file  . Food insecurity:    Worry: Not on file    Inability: Not on file  . Transportation needs:    Medical: Not on file    Non-medical: Not on file  Tobacco Use  . Smoking status: Never Smoker  . Smokeless tobacco: Never Used  Substance and Sexual Activity  . Alcohol use: No    Alcohol/week: 0.0 standard drinks  . Drug use: No  . Sexual activity: Not Currently  Lifestyle  . Physical activity:    Days per week: Not on file    Minutes per session: Not on file  . Stress: Not on file  Relationships  . Social connections:    Talks on phone: Not on file    Gets together: Not on file    Attends religious service: Not on file    Active member of club or organization: Not on file    Attends meetings of clubs or organizations: Not on file  Relationship status: Not on file  . Intimate partner violence:    Fear of current or ex partner: Not on file    Emotionally abused: Not on file    Physically abused: Not on file    Forced sexual activity: Not on file  Other Topics Concern  . Not on file  Social History Narrative  . Not on file   Review of Systems  Constitutional: Negative for fever and weight loss.  HENT: Negative for ear discharge, ear pain, hearing loss and tinnitus.   Eyes: Negative for blurred vision, double vision, photophobia and pain.  Respiratory: Negative for cough and shortness of breath.   Cardiovascular: Negative for chest pain and palpitations.  Gastrointestinal: Negative for abdominal pain, blood in stool, constipation, diarrhea, heartburn, melena, nausea and vomiting.  Genitourinary: Negative for dysuria, flank pain, frequency,  hematuria and urgency.  Musculoskeletal: Positive for joint pain. Negative for falls.  Neurological: Negative for dizziness, loss of consciousness and headaches.  Endo/Heme/Allergies: Negative for environmental allergies.  Psychiatric/Behavioral: Negative for depression, hallucinations, substance abuse and suicidal ideas. The patient is not nervous/anxious and does not have insomnia.    BP 110/76   Pulse 69   Temp (!) 97.5 F (36.4 C) (Oral)   Resp 15   Ht 5' 1.25" (1.556 m)   Wt 125 lb 3.2 oz (56.8 kg)   SpO2 99%   BMI 23.46 kg/m   Physical Exam  Constitutional: She is oriented to person, place, and time. She appears well-developed and well-nourished.  HENT:  Head: Normocephalic and atraumatic.  Right Ear: Tympanic membrane, external ear and ear canal normal.  Left Ear: Tympanic membrane, external ear and ear canal normal.  Nose: Nose normal. No mucosal edema.  Mouth/Throat: Uvula is midline, oropharynx is clear and moist and mucous membranes are normal. No oropharyngeal exudate or posterior oropharyngeal erythema.  Eyes: Pupils are equal, round, and reactive to light. Conjunctivae are normal.  Neck: Neck supple. No thyromegaly present.  Cardiovascular: Normal rate, regular rhythm, normal heart sounds and intact distal pulses.  Pulses:      Dorsalis pedis pulses are 2+ on the right side, and 2+ on the left side.       Posterior tibial pulses are 2+ on the right side, and 2+ on the left side.  No peripheral edema noted.  Pulmonary/Chest: Effort normal and breath sounds normal. No respiratory distress. She has no wheezes. She has no rales.  Abdominal: Soft. Bowel sounds are normal. She exhibits no distension and no mass. There is no tenderness. There is no rebound and no guarding.  Musculoskeletal:       Right ankle: Normal.       Left ankle: Normal.       Right lower leg: Normal.       Left lower leg: Normal.       Right foot: Normal.       Left foot: Normal.    Lymphadenopathy:    She has no cervical adenopathy.  Neurological: She is alert and oriented to person, place, and time. No cranial nerve deficit.  Skin: Skin is warm and dry. No rash noted.  Psychiatric: She has a normal mood and affect.  Vitals reviewed.  Assessment/Plan: 1. Need for influenza vaccination Flu shot updated today. - Flu vaccine HIGH DOSE PF (Fluzone High dose)  2. Visit for preventive health examination Depression screen negative. Health Maintenance reviewed. Preventive schedule discussed and handout given in AVS. Will obtain fasting labs today.  - CBC  with Differential/Platelet - Comprehensive metabolic panel - Lipid panel - Hemoglobin A1c - Iron - due to RLS symptoms  3. Need for pneumococcal vaccination Prevnar given today. - Pneumococcal conjugate vaccine 13-valent  4. RLS (restless legs syndrome) Noting the start of mild RLS symptoms. Will check iron level today. If low will start supplementation. If normal, will start trial or Requip.  5. Left ankle swelling None currently. Exam unremarkable. Supportive measures discussed. Follow-up if recurring.    Leeanne Rio, PA-C

## 2018-07-05 NOTE — Patient Instructions (Signed)
Please go to the lab for blood work.   Our office will call you with your results unless you have chosen to receive results via MyChart.  If your blood work is normal we will follow-up each year for physicals and as scheduled for chronic medical problems.  If anything is abnormal we will treat accordingly and get you in for a follow-up.  You will be contacted to schedule your mammogram and bone density test. It is very important that we have these done ASAP as they are well overdue.  For the ankle, wear and ace wrap as compression adds support and pain relief.  Elevate leg while resting. Tylenol for pain. Can apply [facility] or Capsacin to the area.   I am checking iron level today due to signs of beginning restless leg.      Preventive Care 66 Years and Older, Female Preventive care refers to lifestyle choices and visits with your health care provider that can promote health and wellness. What does preventive care include?  A yearly physical exam. This is also called an annual well check.  Dental exams once or twice a year.  Routine eye exams. Ask your health care provider how often you should have your eyes checked.  Personal lifestyle choices, including: ? Daily care of your teeth and gums. ? Regular physical activity. ? Eating a healthy diet. ? Avoiding tobacco and drug use. ? Limiting alcohol use. ? Practicing safe sex. ? Taking low-dose aspirin every day. ? Taking vitamin and mineral supplements as recommended by your health care provider. What happens during an annual well check? The services and screenings done by your health care provider during your annual well check will depend on your age, overall health, lifestyle risk factors, and family history of disease. Counseling Your health care provider may ask you questions about your:  Alcohol use.  Tobacco use.  Drug use.  Emotional well-being.  Home and relationship well-being.  Sexual activity.  Eating  habits.  History of falls.  Memory and ability to understand (cognition).  Work and work Statistician.  Reproductive health.  Screening You may have the following tests or measurements:  Height, weight, and BMI.  Blood pressure.  Lipid and cholesterol levels. These may be checked every 5 years, or more frequently if you are over 58 years old.  Skin check.  Lung cancer screening. You may have this screening every year starting at age 33 if you have a 30-pack-year history of smoking and currently smoke or have quit within the past 15 years.  Fecal occult blood test (FOBT) of the stool. You may have this test every year starting at age 48.  Flexible sigmoidoscopy or colonoscopy. You may have a sigmoidoscopy every 5 years or a colonoscopy every 10 years starting at age 84.  Hepatitis C blood test.  Hepatitis B blood test.  Sexually transmitted disease (STD) testing.  Diabetes screening. This is done by checking your blood sugar (glucose) after you have not eaten for a while (fasting). You may have this done every 1-3 years.  Bone density scan. This is done to screen for osteoporosis. You may have this done starting at age 25.  Mammogram. This may be done every 1-2 years. Talk to your health care provider about how often you should have regular mammograms.  Talk with your health care provider about your test results, treatment options, and if necessary, the need for more tests. Vaccines Your health care provider may recommend certain vaccines, such as:  Influenza vaccine. This is recommended every year.  Tetanus, diphtheria, and acellular pertussis (Tdap, Td) vaccine. You may need a Td booster every 10 years.  Varicella vaccine. You may need this if you have not been vaccinated.  Zoster vaccine. You may need this after age 56.  Measles, mumps, and rubella (MMR) vaccine. You may need at least one dose of MMR if you were born in 1957 or later. You may also need a second  dose.  Pneumococcal 13-valent conjugate (PCV13) vaccine. One dose is recommended after age 20.  Pneumococcal polysaccharide (PPSV23) vaccine. One dose is recommended after age 21.  Meningococcal vaccine. You may need this if you have certain conditions.  Hepatitis A vaccine. You may need this if you have certain conditions or if you travel or work in places where you may be exposed to hepatitis A.  Hepatitis B vaccine. You may need this if you have certain conditions or if you travel or work in places where you may be exposed to hepatitis B.  Haemophilus influenzae type b (Hib) vaccine. You may need this if you have certain conditions.  Talk to your health care provider about which screenings and vaccines you need and how often you need them. This information is not intended to replace advice given to you by your health care provider. Make sure you discuss any questions you have with your health care provider. Document Released: 11/19/2015 Document Revised: 07/12/2016 Document Reviewed: 08/24/2015 Elsevier Interactive Patient Education  [employer].

## 2018-07-09 ENCOUNTER — Other Ambulatory Visit: Payer: Self-pay

## 2018-07-09 DIAGNOSIS — R944 Abnormal results of kidney function studies: Secondary | ICD-10-CM

## 2018-07-11 ENCOUNTER — Other Ambulatory Visit: Payer: Self-pay | Admitting: Physician Assistant

## 2018-07-11 ENCOUNTER — Encounter: Payer: Self-pay | Admitting: Emergency Medicine

## 2018-07-11 MED ORDER — ROPINIROLE HCL 0.25 MG PO TABS: 0.2500 mg | ORAL_TABLET | Freq: Every day | ORAL | 1 refills | Status: DC

## 2018-07-22 ENCOUNTER — Inpatient Hospital Stay: Payer: BLUE CROSS/BLUE SHIELD | Attending: Nurse Practitioner | Admitting: Nurse Practitioner

## 2018-08-05 ENCOUNTER — Encounter: Payer: Self-pay | Admitting: Physician Assistant

## 2018-08-05 ENCOUNTER — Ambulatory Visit: Payer: BLUE CROSS/BLUE SHIELD | Admitting: Physician Assistant

## 2018-08-05 ENCOUNTER — Other Ambulatory Visit: Payer: Self-pay

## 2018-08-05 VITALS — BP 100/62 | HR 68 | Temp 97.6°F | Resp 14 | Ht 61.0 in | Wt 127.0 lb

## 2018-08-05 DIAGNOSIS — S63501A Unspecified sprain of right wrist, initial encounter: Secondary | ICD-10-CM | POA: Diagnosis not present

## 2018-08-05 MED ORDER — NAPROXEN 500 MG PO TABS: 500.0000 mg | ORAL_TABLET | Freq: Two times a day (BID) | ORAL | 0 refills | Status: DC

## 2018-08-05 NOTE — Progress Notes (Signed)
Patient presents to clinic today c/o pain in right wrist starting yesterday when a cooking pot fell onto her wrist. Translator present for visit. Notes she was trying to pull the pot down from a high shelf when it fell down, hitting the volar side of her wrist. Notes pain and swelling without bruising. Notes it hurts some to contract her hand. No issue with opening the hand. Denies any numbness or tingling. Has not done anything for symptoms.   Past Medical History:  Diagnosis Date  . Anal cancer ([place])   . Chicken pox     Current Outpatient Medications on File Prior to Visit  Medication Sig Dispense Refill  . naproxen sodium (ANAPROX) 220 MG tablet Take 220 mg by mouth as needed.    . Omega-3 Fatty Acids (FISH OIL PO) Take by mouth. Take 1 capsule by mouth 2 times daily.    Marland Kitchen rOPINIRole (REQUIP) 0.25 MG tablet Take 1 tablet (0.25 mg total) by mouth at bedtime. 30 tablet 1   No current facility-administered medications on file prior to visit.     No Known Allergies  Family History  Problem Relation Age of Onset  . Diabetes Mother   . Hypertension Mother   . Colon polyps Neg Hx   . Rectal cancer Neg Hx     Social History   Socioeconomic History  . Marital status: Married    Spouse name: Not on file  . Number of children: Not on file  . Years of education: Not on file  . Highest education level: Not on file  Occupational History  . Not on file  Social Needs  . Financial resource strain: Not on file  . Food insecurity:    Worry: Not on file    Inability: Not on file  . Transportation needs:    Medical: Not on file    Non-medical: Not on file  Tobacco Use  . Smoking status: Never Smoker  . Smokeless tobacco: Never Used  Substance and Sexual Activity  . Alcohol use: No    Alcohol/week: 0.0 standard drinks  . Drug use: No  . Sexual activity: Not Currently  Lifestyle  . Physical activity:    Days per week: Not on file    Minutes per session: Not on file  . Stress:  Not on file  Relationships  . Social connections:    Talks on phone: Not on file    Gets together: Not on file    Attends religious service: Not on file    Active member of club or organization: Not on file    Attends meetings of clubs or organizations: Not on file    Relationship status: Not on file  Other Topics Concern  . Not on file  Social History Narrative  . Not on file   Review of Systems - See HPI.  All other ROS are negative.  BP 100/62   Pulse 68   Temp 97.6 F (36.4 C) (Oral)   Resp 14   Ht 5\' 1"  (1.549 m)   Wt 127 lb (57.6 kg)   SpO2 99%   BMI 24.00 kg/m   Physical Exam  Constitutional: She is oriented to person, place, and time. She appears well-developed and well-nourished.  HENT:  Head: Normocephalic and atraumatic.  Neck: Neck supple.  Cardiovascular: Normal rate and regular rhythm.  Pulmonary/Chest: Effort normal and breath sounds normal.  Musculoskeletal:       Right wrist: She exhibits tenderness and swelling. She exhibits normal  range of motion, no bony tenderness, no effusion and no laceration.  No bruising of wrist. Extremity is neurovascularly intact.  Neurological: She is alert and oriented to person, place, and time.  Psychiatric: She has a normal mood and affect.  Vitals reviewed.  Recent Results (from the past 2160 hour(s))  CBC with Differential/Platelet     Status: Abnormal   Collection Time: 07/05/18  9:26 AM  Result Value Ref Range   WBC 2.1 Repeated and verified X2. (L) 4.0 - 10.5 K/uL   RBC 3.88 3.87 - 5.11 Mil/uL   Hemoglobin 12.0 12.0 - 15.0 g/dL   HCT 36.4 36.0 - 46.0 %   MCV 93.9 78.0 - 100.0 fl   MCHC 32.9 30.0 - 36.0 g/dL   RDW 13.6 11.5 - 15.5 %   Platelets 191.0 150.0 - 400.0 K/uL   Neutrophils Relative % 50.4 43.0 - 77.0 %   Lymphocytes Relative 38.0 12.0 - 46.0 %   Monocytes Relative 9.0 3.0 - 12.0 %   Eosinophils Relative 2.1 0.0 - 5.0 %   Basophils Relative 0.5 0.0 - 3.0 %   Neutro Abs 1.0 (L) 1.4 - 7.7 K/uL    Lymphs Abs 0.8 0.7 - 4.0 K/uL   Monocytes Absolute 0.2 0.1 - 1.0 K/uL   Eosinophils Absolute 0.0 0.0 - 0.7 K/uL   Basophils Absolute 0.0 0.0 - 0.1 K/uL  Comprehensive metabolic panel     Status: Abnormal   Collection Time: 07/05/18  9:26 AM  Result Value Ref Range   Sodium 141 135 - 145 mEq/L   Potassium 4.6 3.5 - 5.1 mEq/L   Chloride 104 96 - 112 mEq/L   CO2 31 19 - 32 mEq/L   Glucose, Bld 80 70 - 99 mg/dL   BUN 17 6 - 23 mg/dL   Creatinine, Ser 1.02 0.40 - 1.20 mg/dL   Total Bilirubin 0.6 0.2 - 1.2 mg/dL   Alkaline Phosphatase 46 39 - 117 U/L   AST 15 0 - 37 U/L   ALT 10 0 - 35 U/L   Total Protein 7.2 6.0 - 8.3 g/dL   Albumin 4.3 3.5 - 5.2 g/dL   Calcium 9.4 8.4 - 10.5 mg/dL   GFR 57.65 (L) >60.00 mL/min  Lipid panel     Status: Abnormal   Collection Time: 07/05/18  9:26 AM  Result Value Ref Range   Cholesterol 200 0 - 200 mg/dL    Comment: ATP III Classification       Desirable:  < 200 mg/dL               Borderline High:  200 - 239 mg/dL          High:  > = 240 mg/dL   Triglycerides 117.0 0.0 - 149.0 mg/dL    Comment: Normal:  <150 mg/dLBorderline High:  150 - 199 mg/dL   HDL 57.50 >39.00 mg/dL   VLDL 23.4 0.0 - 40.0 mg/dL   LDL Cholesterol 120 (H) 0 - 99 mg/dL   Total CHOL/HDL Ratio 3     Comment:                Men          Women1/2 Average Risk     3.4          3.3Average Risk          5.0          4.42X Average Risk  9.6          7.13X Average Risk          15.0          11.0                       NonHDL 142.94     Comment: NOTE:  Non-HDL goal should be 30 mg/dL higher than patient's LDL goal (i.e. LDL goal of < 70 mg/dL, would have non-HDL goal of < 100 mg/dL)  Hemoglobin A1c     Status: None   Collection Time: 07/05/18  9:26 AM  Result Value Ref Range   Hgb A1c MFr Bld 5.7 4.6 - 6.5 %    Comment: Glycemic Control Guidelines for People with Diabetes:Non Diabetic:  <6%Goal of Therapy: <7%Additional Action Suggested:  >8%   Iron     Status: None   Collection  Time: 07/05/18  9:26 AM  Result Value Ref Range   Iron 86 42 - 145 ug/dL   Assessment/Plan: 1. Sprain of right wrist, initial encounter No sign of fracture. Mild contusion and sprain likely. Will start bracing and NSAID. Rx Naproxen for her to use as directed. Ice and elevate wrist. Stretches reviewed. Close follow-up discussed with patient and son. If not calming down in a few days or new symptoms noted, will consider imaging.    Leeanne Rio, PA-C

## 2018-08-05 NOTE — Patient Instructions (Signed)
Please keep wrist iced and elevated.  Wear the brace during the day over the next week. Can take off at night. Take the anti-inflammatory as directed with food, when needed for pain. If not improving over the next few days we will need to consider imaging.    Wrist Sprain, Adult A wrist sprain is a stretch or tear in the strong, fibrous tissues (ligaments) that connect your wrist bones. There are three types of wrist sprains:  Grade 1. In this type of sprain, the ligament is stretched more than normal.  Grade 2. In this type of sprain, the ligament is partially torn. You may be able to move your wrist, but not very much.  Grade 3. In this type of sprain, the ligament or muscle is completely torn. You may find it difficult or extremely painful to move your wrist even a little.  What are the causes? A wrist sprain can be caused by using the wrist too much during sports, exercise, or at work. It can also happen with a fall or during an accident. What increases the risk? This condition is more likely to occur in people:  With a previous wrist or arm injury.  With poor wrist strength and flexibility.  Who play contact sports, such as football or soccer.  Who play sports that may result in a fall, such as skateboarding, biking, skiing, or snowboarding.  Who do not exercise regularly.  Who use exercise equipment that does not fit well.  What are the signs or symptoms? Symptoms of this condition include:  Pain in the wrist, arm, or hand.  Swelling or bruised skin near the wrist, hand, or arm. The skin may look yellow or kind of blue.  Stiffness or trouble moving the hand.  Hearing a pop or feeling a tear at the time of the injury.  A warm feeling in the skin around the wrist.  How is this diagnosed? This condition is diagnosed with a physical exam. Sometimes an X-ray is taken to make sure a bone did not break. If your health care provider thinks that you tore a ligament, he or  she may order an MRI of your wrist. How is this treated? This condition is treated by resting and applying ice to your wrist. Additional treatment may include:  Medicine for pain and inflammation.  A splint to keep your wrist still (immobilized).  Exercises to strengthen and stretch your wrist.  Surgery. This may be done if the ligament is completely torn.  Follow these instructions at home: If you have a splint:   Do not put pressure on any part of the splint until it is fully hardened. This may take several hours.  Wear the splint as told by your health care provider. Remove it only as told by your health care provider.  Loosen the splint if your fingers tingle, become numb, or turn cold and blue.  If your splint is not waterproof: ? Do not let it get wet. ? Cover it with a watertight covering when you take a bath or a shower.  Keep the splint clean. Managing pain, stiffness, and swelling   If directed, put ice on the injured area. ? If you have a removable splint, remove it as told by your health care provider. ? Put ice in a plastic bag. ? Place a towel between your skin and the bag or between the splint and the bag. ? Leave the ice on for 20 minutes, 2-3 times per day.  Move  your fingers often to avoid stiffness and to lessen swelling.  Raise (elevate) the injured area above the level of your heart while you are sitting or lying down. Activity  Rest your wrist. Do not do things that cause pain.  Return to your normal activities as told by your health care provider. Ask your health care provider what activities are safe for you.  Do exercises as told by your health care provider. General instructions  Take over-the-counter and prescription medicines only as told by your health care provider.  Do not use any products that contain nicotine or tobacco, such as cigarettes and e-cigarettes. These can delay healing. If you need help quitting, ask your health care  provider.  Ask your health care provider when it is safe to drive if you have a splint.  Keep all follow-up visits as told by your health care provider. This is important. Contact a health care provider if:  Your pain, bruising, or swelling gets worse.  Your skin becomes red, gets a rash, or has open sores.  Your pain does not get better or it gets worse. Get help right away if:  You have a new or sudden sharp pain in the hand, arm, or wrist.  You have tingling or numbness in your hand.  Your fingers turn white, very red, or cold and blue.  You cannot move your fingers. This information is not intended to replace advice given to you by your health care provider. Make sure you discuss any questions you have with your health care provider. Document Released: 06/26/2014 Document Revised: 05/20/2016 Document Reviewed: 05/11/2016 Elsevier Interactive Patient Education  [employer].

## 2019-01-22 ENCOUNTER — Telehealth: Payer: Self-pay | Admitting: *Deleted

## 2019-01-22 NOTE — Telephone Encounter (Signed)
Called patient to inform that provider wants fu cancelled on 01-28-19 and moved to 04-01-19 @ 8:30 am, lvm for a return call

## 2019-01-28 ENCOUNTER — Ambulatory Visit: Payer: Self-pay | Admitting: Radiation Oncology

## 2019-03-12 ENCOUNTER — Telehealth: Payer: Self-pay | Admitting: *Deleted

## 2019-03-12 NOTE — Telephone Encounter (Signed)
Called patient to ask about rescheduling fu on 04-01-19 to 05-12-19 @ 1 pm, lvm for a return call

## 2019-04-01 ENCOUNTER — Ambulatory Visit: Payer: BLUE CROSS/BLUE SHIELD | Admitting: Radiation Oncology

## 2019-05-12 ENCOUNTER — Ambulatory Visit
Admission: RE | Admit: 2019-05-12 | Discharge: 2019-05-12 | Disposition: A | Discharge status: Home / Self Care | Payer: BLUE CROSS/BLUE SHIELD | Source: Ambulatory Visit | Attending: Radiation Oncology | Admitting: Radiation Oncology

## 2019-05-12 ENCOUNTER — Other Ambulatory Visit: Payer: Self-pay

## 2019-08-18 ENCOUNTER — Encounter: Payer: BLUE CROSS/BLUE SHIELD | Admitting: Physician Assistant

## 2019-08-20 ENCOUNTER — Encounter: Payer: Self-pay | Admitting: Physician Assistant

## 2019-08-20 ENCOUNTER — Other Ambulatory Visit: Payer: Self-pay

## 2019-08-20 ENCOUNTER — Ambulatory Visit (INDEPENDENT_AMBULATORY_CARE_PROVIDER_SITE_OTHER): Payer: BLUE CROSS/BLUE SHIELD | Admitting: Physician Assistant

## 2019-08-20 VITALS — BP 100/78 | HR 71 | Temp 98.0°F | Resp 14 | Ht 61.0 in | Wt 133.0 lb

## 2019-08-20 DIAGNOSIS — Z780 Asymptomatic menopausal state: Secondary | ICD-10-CM

## 2019-08-20 DIAGNOSIS — Z0000 Encounter for general adult medical examination without abnormal findings: Secondary | ICD-10-CM | POA: Diagnosis not present

## 2019-08-20 DIAGNOSIS — Z23 Encounter for immunization: Secondary | ICD-10-CM | POA: Diagnosis not present

## 2019-08-20 DIAGNOSIS — Z1231 Encounter for screening mammogram for malignant neoplasm of breast: Secondary | ICD-10-CM | POA: Diagnosis not present

## 2019-08-20 LAB — CBC WITH DIFFERENTIAL/PLATELET
Basophils Absolute: 0 10*3/uL (ref 0.0–0.1)
Basophils Relative: 0.3 % (ref 0.0–3.0)
Eosinophils Absolute: 0.1 10*3/uL (ref 0.0–0.7)
Eosinophils Relative: 1.9 % (ref 0.0–5.0)
HCT: 36.6 % (ref 36.0–46.0)
Hemoglobin: 12 g/dL (ref 12.0–15.0)
Lymphocytes Relative: 34.1 % (ref 12.0–46.0)
Lymphs Abs: 0.9 10*3/uL (ref 0.7–4.0)
MCHC: 32.7 g/dL (ref 30.0–36.0)
MCV: 93.2 fl (ref 78.0–100.0)
Monocytes Absolute: 0.2 10*3/uL (ref 0.1–1.0)
Monocytes Relative: 9.3 % (ref 3.0–12.0)
Neutro Abs: 1.5 10*3/uL (ref 1.4–7.7)
Neutrophils Relative %: 54.4 % (ref 43.0–77.0)
Platelets: 201 10*3/uL (ref 150.0–400.0)
RBC: 3.93 Mil/uL (ref 3.87–5.11)
RDW: 13.4 % (ref 11.5–15.5)
WBC: 2.7 10*3/uL — ABNORMAL LOW (ref 4.0–10.5)

## 2019-08-20 LAB — LIPID PANEL
Cholesterol: 186 mg/dL (ref 0–200)
HDL: 49 mg/dL (ref >39.00)
LDL Cholesterol: 107 mg/dL — ABNORMAL HIGH (ref 0–99)
NonHDL: 136.73
Total CHOL/HDL Ratio: 4
Triglycerides: 147 mg/dL (ref 0.0–149.0)
VLDL: 29.4 mg/dL (ref 0.0–40.0)

## 2019-08-20 LAB — COMPREHENSIVE METABOLIC PANEL
ALT: 11 U/L (ref 0–35)
AST: 16 U/L (ref 0–37)
Albumin: 4.4 g/dL (ref 3.5–5.2)
Alkaline Phosphatase: 57 U/L (ref 39–117)
BUN: 14 mg/dL (ref 6–23)
CO2: 28 mEq/L (ref 19–32)
Calcium: 9.2 mg/dL (ref 8.4–10.5)
Chloride: 106 mEq/L (ref 96–112)
Creatinine, Ser: 0.92 mg/dL (ref 0.40–1.20)
GFR: 60.89 mL/min (ref >60.00)
Glucose, Bld: 77 mg/dL (ref 70–99)
Potassium: 4.5 mEq/L (ref 3.5–5.1)
Sodium: 142 mEq/L (ref 135–145)
Total Bilirubin: 0.5 mg/dL (ref 0.2–1.2)
Total Protein: 7 g/dL (ref 6.0–8.3)

## 2019-08-20 LAB — HEMOGLOBIN A1C: Hgb A1c MFr Bld: 6 % (ref 4.6–6.5)

## 2019-08-20 NOTE — Patient Instructions (Addendum)
Please go to the lab for blood work.   Our office will call you with your results unless you have chosen to receive results via MyChart.  If your blood work is normal we will follow-up each year for physicals and as scheduled for chronic medical problems.  If anything is abnormal we will treat accordingly and get you in for a follow-up.  Make sure to watch salt intake. Elevate legs while resting. Consider wearing compression stocking.  For tension headaches, try to watch your posture. Consider a topical OTC BioFreeze or Voltaren cream applied to the neck and shoulder to help relax muscles and alleviate tension.    Preventive Care 21 Years and Older, Female Preventive care refers to lifestyle choices and visits with your health care provider that can promote health and wellness. This includes:  A yearly physical exam. This is also called an annual well check.  Regular dental and eye exams.  Immunizations.  Screening for certain conditions.  Healthy lifestyle choices, such as diet and exercise. What can I expect for my preventive care visit? Physical exam Your health care provider will check:  Height and weight. These may be used to calculate body mass index (BMI), which is a measurement that tells if you are at a healthy weight.  Heart rate and blood pressure.  Your skin for abnormal spots. Counseling Your health care provider may ask you questions about:  Alcohol, tobacco, and drug use.  Emotional well-being.  Home and relationship well-being.  Sexual activity.  Eating habits.  History of falls.  Memory and ability to understand (cognition).  Work and work Statistician.  Pregnancy and menstrual history. What immunizations do I need?  Influenza (flu) vaccine  This is recommended every year. Tetanus, diphtheria, and pertussis (Tdap) vaccine  You may need a Td booster every 10 years. Varicella (chickenpox) vaccine  You may need this vaccine if you have  not already been vaccinated. Zoster (shingles) vaccine  You may need this after age 33. Pneumococcal conjugate (PCV13) vaccine  One dose is recommended after age 80. Pneumococcal polysaccharide (PPSV23) vaccine  One dose is recommended after age 54. Measles, mumps, and rubella (MMR) vaccine  You may need at least one dose of MMR if you were born in 1957 or later. You may also need a second dose. Meningococcal conjugate (MenACWY) vaccine  You may need this if you have certain conditions. Hepatitis A vaccine  You may need this if you have certain conditions or if you travel or work in places where you may be exposed to hepatitis A. Hepatitis B vaccine  You may need this if you have certain conditions or if you travel or work in places where you may be exposed to hepatitis B. Haemophilus influenzae type b (Hib) vaccine  You may need this if you have certain conditions. You may receive vaccines as individual doses or as more than one vaccine together in one shot (combination vaccines). Talk with your health care provider about the risks and benefits of combination vaccines. What tests do I need? Blood tests  Lipid and cholesterol levels. These may be checked every 5 years, or more frequently depending on your overall health.  Hepatitis C test.  Hepatitis B test. Screening  Lung cancer screening. You may have this screening every year starting at age 68 if you have a 30-pack-year history of smoking and currently smoke or have quit within the past 15 years.  Colorectal cancer screening. All adults should have this screening starting at age  38 and continuing until age 10. Your health care provider may recommend screening at age 71 if you are at increased risk. You will have tests every 1-10 years, depending on your results and the type of screening test.  Diabetes screening. This is done by checking your blood sugar (glucose) after you have not eaten for a while (fasting). You may  have this done every 1-3 years.  Mammogram. This may be done every 1-2 years. Talk with your health care provider about how often you should have regular mammograms.  BRCA-related cancer screening. This may be done if you have a family history of breast, ovarian, tubal, or peritoneal cancers. Other tests  Sexually transmitted disease (STD) testing.  Bone density scan. This is done to screen for osteoporosis. You may have this done starting at age 66. Follow these instructions at home: Eating and drinking  Eat a diet that includes fresh fruits and vegetables, whole grains, lean protein, and low-fat dairy products. Limit your intake of foods with high amounts of sugar, saturated fats, and salt.  Take vitamin and mineral supplements as recommended by your health care provider.  Do not drink alcohol if your health care provider tells you not to drink.  If you drink alcohol: ? Limit how much you have to 0-1 drink a day. ? Be aware of how much alcohol is in your drink. In the [place] one drink equals one 12 oz bottle of beer (355 mL), one 5 oz glass of wine (148 mL), or one 1 oz glass of hard liquor (44 mL). Lifestyle  Take daily care of your teeth and gums.  Stay active. Exercise for at least 30 minutes on 5 or more days each week.  Do not use any products that contain nicotine or tobacco, such as cigarettes, e-cigarettes, and chewing tobacco. If you need help quitting, ask your health care provider.  If you are sexually active, practice safe sex. Use a condom or other form of protection in order to prevent STIs (sexually transmitted infections).  Talk with your health care provider about taking a low-dose aspirin or statin. What's next?  Go to your health care provider once a year for a well check visit.  Ask your health care provider how often you should have your eyes and teeth checked.  Stay up to date on all vaccines. This information is not intended to replace advice given to  you by your health care provider. Make sure you discuss any questions you have with your health care provider. Document Released: 11/19/2015 Document Revised: 10/17/2018 Document Reviewed: 10/17/2018 Elsevier Patient Education  2020 [employer].      .

## 2019-08-20 NOTE — Progress Notes (Signed)
Patient presents to clinic today for annual exam.  Patient is fasting for labs.  Acute Concerns: Denies acute concerns today.  Health Maintenance: Immunizations -- Due for Flu and Pneumonia vaccine. Agrees to this today..  Colonoscopy -- UTD Mammogram -- Overdue. Have placed order again. Bone Density -- Due. Order placed.  Past Medical History:  Diagnosis Date   Anal cancer ([place])    Chicken pox     Past Surgical History:  Procedure Laterality Date   IR FLUORO GUIDE CV LINE RIGHT  02/19/2017   IR GENERIC HISTORICAL  01/22/2017   IR US GUIDE VASC ACCESS RIGHT 01/22/2017 Corrie Mckusick, DO [facility]   IR GENERIC HISTORICAL  01/22/2017   IR FLUORO GUIDE CV LINE RIGHT 01/22/2017 Corrie Mckusick, DO [facility]   IR US GUIDE VASC ACCESS RIGHT  02/19/2017   TUBAL LIGATION      Current Outpatient Medications on File Prior to Visit  Medication Sig Dispense Refill   Omega-3 Fatty Acids (FISH OIL PO) Take by mouth. Take 1 capsule by mouth 2 times daily.     No current facility-administered medications on file prior to visit.     No Known Allergies  Family History  Problem Relation Age of Onset   Diabetes Mother    Hypertension Mother    Colon polyps Neg Hx    Rectal cancer Neg Hx     Social History   Socioeconomic History   Marital status: Married    Spouse name: Not on file   Number of children: Not on file   Years of education: Not on file   Highest education level: Not on file  Occupational History   Not on file  Social Needs   Financial resource strain: Not on file   Food insecurity    Worry: Not on file    Inability: Not on file   Transportation needs    Medical: Not on file    Non-medical: Not on file  Tobacco Use   Smoking status: Never Smoker   Smokeless tobacco: Never Used  Substance and Sexual Activity   Alcohol use: No    Alcohol/week: 0.0 standard drinks   Drug use: No   Sexual activity: Not Currently  Lifestyle    Physical activity    Days per week: Not on file    Minutes per session: Not on file   Stress: Not on file  Relationships   Social connections    Talks on phone: Not on file    Gets together: Not on file    Attends religious service: Not on file    Active member of club or organization: Not on file    Attends meetings of clubs or organizations: Not on file    Relationship status: Not on file   Intimate partner violence    Fear of current or ex partner: Not on file    Emotionally abused: Not on file    Physically abused: Not on file    Forced sexual activity: Not on file  Other Topics Concern   Not on file  Social History Narrative   Not on file   Review of Systems  Constitutional: Negative for fever and weight loss.  HENT: Negative for ear discharge, ear pain, hearing loss and tinnitus.   Eyes: Negative for blurred vision, double vision, photophobia and pain.  Respiratory: Negative for cough and shortness of breath.   Cardiovascular: Negative for chest pain and palpitations.  Gastrointestinal: Negative for abdominal pain, blood  in stool, constipation, diarrhea, heartburn, melena, nausea and vomiting.  Genitourinary: Negative for dysuria, flank pain, frequency, hematuria and urgency.  Musculoskeletal: Negative for falls.  Neurological: Negative for dizziness, loss of consciousness and headaches.  Endo/Heme/Allergies: Negative for environmental allergies.  Psychiatric/Behavioral: Negative for depression, hallucinations, substance abuse and suicidal ideas. The patient is not nervous/anxious and does not have insomnia.    BP 100/78    Pulse 71    Temp 98 F (36.7 C) (Temporal)    Resp 14    Ht 5\' 1"  (1.549 m)    Wt 133 lb (60.3 kg)    SpO2 99%    BMI 25.13 kg/m   Physical Exam Vitals signs reviewed.  HENT:     Head: Normocephalic and atraumatic.     Right Ear: Tympanic membrane, ear canal and external ear normal.     Left Ear: Tympanic membrane, ear canal and external ear  normal.     Nose: Nose normal. No mucosal edema.     Mouth/Throat:     Pharynx: Uvula midline. No oropharyngeal exudate or posterior oropharyngeal erythema.  Eyes:     Conjunctiva/sclera: Conjunctivae normal.     Pupils: Pupils are equal, round, and reactive to light.  Neck:     Musculoskeletal: Neck supple.     Thyroid: No thyromegaly.  Cardiovascular:     Rate and Rhythm: Normal rate and regular rhythm.     Heart sounds: Normal heart sounds.  Pulmonary:     Effort: Pulmonary effort is normal. No respiratory distress.     Breath sounds: Normal breath sounds. No wheezing or rales.  Abdominal:     General: Bowel sounds are normal. There is no distension.     Palpations: Abdomen is soft. There is no mass.     Tenderness: There is no abdominal tenderness. There is no guarding or rebound.  Lymphadenopathy:     Cervical: No cervical adenopathy.  Skin:    General: Skin is warm and dry.     Findings: No rash.  Neurological:     Mental Status: She is alert and oriented to person, place, and time.     Cranial Nerves: No cranial nerve deficit.    Assessment/Plan: 1. Visit for preventive health examination Depression screen negative. Health Maintenance reviewed. Preventive schedule discussed and handout given in AVS. Will obtain fasting labs today.  - CBC with Differential/Platelet - Comprehensive metabolic panel - Lipid panel - Hemoglobin A1c  2. Encounter for screening mammogram for malignant neoplasm of breast Average risk. Asymptomatic. Order for screening mammogram placed. - MM DIGITAL SCREENING BILATERAL; Future  3. Postmenopausal estrogen deficiency Order for DEXA placed. Start Citracal-D supplement. - DG Bone Density; Future  4. Need for pneumococcal vaccination - Pneumococcal polysaccharide vaccine 23-valent greater than or equal to 2yo subcutaneous/IM  5. Need for immunization against influenza - Flu Vaccine QUAD High Dose(Fluad)   Leeanne Rio, PA-C

## 2020-03-22 ENCOUNTER — Other Ambulatory Visit: Payer: BLUE CROSS/BLUE SHIELD

## 2020-08-26 ENCOUNTER — Other Ambulatory Visit: Payer: Self-pay

## 2020-08-26 DIAGNOSIS — Z20822 Contact with and (suspected) exposure to covid-19: Secondary | ICD-10-CM

## 2020-08-27 LAB — NOVEL CORONAVIRUS, NAA: SARS-CoV-2, NAA: NOT DETECTED

## 2020-08-27 LAB — SARS-COV-2, NAA 2 DAY TAT

## 2020-09-27 ENCOUNTER — Ambulatory Visit (INDEPENDENT_AMBULATORY_CARE_PROVIDER_SITE_OTHER): Payer: Medicare HMO | Admitting: Physician Assistant

## 2020-09-27 ENCOUNTER — Other Ambulatory Visit: Payer: Self-pay

## 2020-09-27 ENCOUNTER — Encounter: Payer: Self-pay | Admitting: Physician Assistant

## 2020-09-27 VITALS — BP 118/80 | HR 67 | Temp 98.3°F | Resp 14 | Ht 61.0 in | Wt 135.0 lb

## 2020-09-27 DIAGNOSIS — Z1231 Encounter for screening mammogram for malignant neoplasm of breast: Secondary | ICD-10-CM | POA: Diagnosis not present

## 2020-09-27 DIAGNOSIS — C210 Malignant neoplasm of anus, unspecified: Secondary | ICD-10-CM | POA: Diagnosis not present

## 2020-09-27 DIAGNOSIS — Z23 Encounter for immunization: Secondary | ICD-10-CM

## 2020-09-27 DIAGNOSIS — Z780 Asymptomatic menopausal state: Secondary | ICD-10-CM

## 2020-09-27 DIAGNOSIS — Z0000 Encounter for general adult medical examination without abnormal findings: Secondary | ICD-10-CM | POA: Diagnosis not present

## 2020-09-27 NOTE — Progress Notes (Signed)
Patient presents to clinic today for annual exam.  Patient is fasting for labs.  Acute Concerns: Denies acute concerns today.   Chronic Issues: History of Anal Cancer -- Followed by Radiation Oncology. Has not seen in some time. Is in remission. Colonoscopy up-to-date.  Health Maintenance: Immunizations -- Flu shot updated today. Colon Cancer Screening -- up-to-date. Mammogram -- Declines mammogram. Bone Density -- Due. Order placed.   Past Medical History:  Diagnosis Date   Anal cancer ([place])    Chicken pox     Past Surgical History:  Procedure Laterality Date   IR FLUORO GUIDE CV LINE RIGHT  02/19/2017   IR GENERIC HISTORICAL  01/22/2017   IR US GUIDE VASC ACCESS RIGHT 01/22/2017 Corrie Mckusick, DO [facility]   IR GENERIC HISTORICAL  01/22/2017   IR FLUORO GUIDE CV LINE RIGHT 01/22/2017 Corrie Mckusick, DO [facility]   IR US GUIDE VASC ACCESS RIGHT  02/19/2017   TUBAL LIGATION      No current outpatient medications on file prior to visit.   No current facility-administered medications on file prior to visit.    No Known Allergies  Family History  Problem Relation Age of Onset   Diabetes Mother    Hypertension Mother    Colon polyps Neg Hx    Rectal cancer Neg Hx     Social History   Socioeconomic History   Marital status: Married    Spouse name: Not on file   Number of children: Not on file   Years of education: Not on file   Highest education level: Not on file  Occupational History   Not on file  Tobacco Use   Smoking status: Never Smoker   Smokeless tobacco: Never Used  Vaping Use   Vaping Use: Never used  Substance and Sexual Activity   Alcohol use: No    Alcohol/week: 0.0 standard drinks   Drug use: No   Sexual activity: Not Currently  Other Topics Concern   Not on file  Social History Narrative   Not on file   Social Determinants of Health   Financial Resource Strain:    Difficulty of Paying Living Expenses:  Not on file  Food Insecurity:    Worried About [place] in the Last Year: Not on file   Ran Out of Food in the Last Year: Not on file  Transportation Needs:    Lack of Transportation (Medical): Not on file   Lack of Transportation (Non-Medical): Not on file  Physical Activity:    Days of Exercise per Week: Not on file   Minutes of Exercise per Session: Not on file  Stress:    Feeling of Stress : Not on file  Social Connections:    Frequency of Communication with Friends and Family: Not on file   Frequency of Social Gatherings with Friends and Family: Not on file   Attends Religious Services: Not on file   Active Member of Clubs or Organizations: Not on file   Attends Archivist Meetings: Not on file   Marital Status: Not on file  Intimate Partner Violence:    Fear of Current or Ex-Partner: Not on file   Emotionally Abused: Not on file   Physically Abused: Not on file   Sexually Abused: Not on file   Review of Systems  Constitutional: Negative for fever and weight loss.  HENT: Negative for ear discharge, ear pain, hearing loss and tinnitus.   Eyes: Negative for blurred vision,  double vision, photophobia and pain.  Respiratory: Negative for cough and shortness of breath.   Cardiovascular: Negative for chest pain and palpitations.  Gastrointestinal: Negative for abdominal pain, blood in stool, constipation, diarrhea, heartburn, melena, nausea and vomiting.  Genitourinary: Negative for dysuria, flank pain, frequency, hematuria and urgency.  Musculoskeletal: Negative for falls.  Neurological: Negative for dizziness, loss of consciousness and headaches.  Endo/Heme/Allergies: Negative for environmental allergies.  Psychiatric/Behavioral: Negative for depression, hallucinations, substance abuse and suicidal ideas. The patient is not nervous/anxious and does not have insomnia.     Wt 135 lb (61.2 kg)    BMI 25.51 kg/m   Physical Exam Vitals  reviewed.  HENT:     Head: Normocephalic and atraumatic.     Right Ear: Tympanic membrane, ear canal and external ear normal.     Left Ear: Tympanic membrane, ear canal and external ear normal.     Nose: Nose normal. No mucosal edema.     Mouth/Throat:     Pharynx: Uvula midline. No oropharyngeal exudate or posterior oropharyngeal erythema.  Eyes:     Conjunctiva/sclera: Conjunctivae normal.     Pupils: Pupils are equal, round, and reactive to light.  Neck:     Thyroid: No thyromegaly.  Cardiovascular:     Rate and Rhythm: Normal rate and regular rhythm.     Heart sounds: Normal heart sounds.  Pulmonary:     Effort: Pulmonary effort is normal. No respiratory distress.     Breath sounds: Normal breath sounds. No wheezing or rales.  Abdominal:     General: Bowel sounds are normal. There is no distension.     Palpations: Abdomen is soft. There is no mass.     Tenderness: There is no abdominal tenderness. There is no guarding or rebound.  Musculoskeletal:     Cervical back: Neck supple.  Lymphadenopathy:     Cervical: No cervical adenopathy.  Skin:    General: Skin is warm and dry.     Findings: No rash.  Neurological:     Mental Status: She is alert and oriented to person, place, and time.     Cranial Nerves: No cranial nerve deficit.     Recent Results (from the past 2160 hour(s))  Novel Coronavirus, NAA ([facility])     Status: None   Collection Time: 08/26/20  6:16 PM   Specimen: Nasopharyngeal(NP) swabs in vial transport medium   Nasopharynge  Screenin  Result Value Ref Range   SARS-CoV-2, NAA Not Detected Not Detected    Comment: This nucleic acid amplification test was developed and its performance characteristics determined by [employer]. Nucleic acid amplification tests include RT-PCR and TMA. This test has not been FDA cleared or approved. This test has been authorized by FDA under an Emergency Use Authorization (EUA). This test is only authorized for the  duration of time the declaration that circumstances exist justifying the authorization of the emergency use of in vitro diagnostic tests for detection of SARS-CoV-2 virus and/or diagnosis of COVID-19 infection under section 564(b)(1) of the Act, 21 U.S.C. 627OJJ-0(K) (1), unless the authorization is terminated or revoked sooner. When diagnostic testing is negative, the possibility of a false negative result should be considered in the context of a patient's recent exposures and the presence of clinical signs and symptoms consistent with COVID-19. An individual without symptoms of COVID-19 and who is not shedding SARS-CoV-2 virus wo uld expect to have a negative (not detected) result in this assay.   SARS-COV-2, NAA 2 DAY TAT  Status: None   Collection Time: 08/26/20  6:16 PM   Nasopharynge  Screenin  Result Value Ref Range   SARS-CoV-2, NAA 2 DAY TAT Performed     Assessment/Plan: 1. Visit for preventive health examination Depression screen negative. Health Maintenance reviewed. Preventive schedule discussed and handout given in AVS. Will obtain fasting labs today.  - CBC with Differential/Platelet - Comprehensive metabolic panel - Lipid panel - TSH  2. Encounter for screening mammogram for malignant neoplasm of breast Declines mammogram and exam. She has been encouraged to reconsider.   3. Anal cancer ([place]) In remission. Overdue for follow-up with specialist. Asymptomatic. She is to contact her Oncologist to set up follow-up.   4. Postmenopausal estrogen deficiency Repeat order for DEXA placed.  - DG Bone Density; Future  5. Need for immunization against influenza - Flu Vaccine QUAD High Dose(Fluad)    This visit occurred during the SARS-CoV-2 public health emergency.  Safety protocols were in place, including screening questions prior to the visit, additional usage of staff PPE, and extensive cleaning of exam room while observing appropriate contact time as  indicated for disinfecting solutions.    Leeanne Rio, PA-C

## 2020-09-27 NOTE — Patient Instructions (Signed)
Please go to the lab for blood work.   Our office will call you with your results unless you have chosen to receive results via MyChart.  If your blood work is normal we will follow-up each year for physicals and as scheduled for chronic medical problems.  If anything is abnormal we will treat accordingly and get you in for a follow-up.  Get topical voltaren gel over-the-counter to apply to elbow when needed.   You will be contacted to schedule a bone density test. Let us know if you reconsider mammogram.   Call your cancer doctor to make sure they do not need to see you for a routine follow-up. Typically they will see you at least once per year.    Preventive Care 68 Years and Older, Female Preventive care refers to lifestyle choices and visits with your health care provider that can promote health and wellness. This includes:  A yearly physical exam. This is also called an annual well check.  Regular dental and eye exams.  Immunizations.  Screening for certain conditions.  Healthy lifestyle choices, such as diet and exercise. What can I expect for my preventive care visit? Physical exam Your health care provider will check:  Height and weight. These may be used to calculate body mass index (BMI), which is a measurement that tells if you are at a healthy weight.  Heart rate and blood pressure.  Your skin for abnormal spots. Counseling Your health care provider may ask you questions about:  Alcohol, tobacco, and drug use.  Emotional well-being.  Home and relationship well-being.  Sexual activity.  Eating habits.  History of falls.  Memory and ability to understand (cognition).  Work and work Statistician.  Pregnancy and menstrual history. What immunizations do I need?  Influenza (flu) vaccine  This is recommended every year. Tetanus, diphtheria, and pertussis (Tdap) vaccine  You may need a Td booster every 10 years. Varicella (chickenpox) vaccine  You  may need this vaccine if you have not already been vaccinated. Zoster (shingles) vaccine  You may need this after age 68. Pneumococcal conjugate (PCV13) vaccine  One dose is recommended after age 68. Pneumococcal polysaccharide (PPSV23) vaccine  One dose is recommended after age 68. Measles, mumps, and rubella (MMR) vaccine  You may need at least one dose of MMR if you were born in 1957 or later. You may also need a second dose. Meningococcal conjugate (MenACWY) vaccine  You may need this if you have certain conditions. Hepatitis A vaccine  You may need this if you have certain conditions or if you travel or work in places where you may be exposed to hepatitis A. Hepatitis B vaccine  You may need this if you have certain conditions or if you travel or work in places where you may be exposed to hepatitis B. Haemophilus influenzae type b (Hib) vaccine  You may need this if you have certain conditions. You may receive vaccines as individual doses or as more than one vaccine together in one shot (combination vaccines). Talk with your health care provider about the risks and benefits of combination vaccines. What tests do I need? Blood tests  Lipid and cholesterol levels. These may be checked every 5 years, or more frequently depending on your overall health.  Hepatitis C test.  Hepatitis B test. Screening  Lung cancer screening. You may have this screening every year starting at age 68 if you have a 30-pack-year history of smoking and currently smoke or have quit within the  past 15 years.  Colorectal cancer screening. All adults should have this screening starting at age 68 and continuing until age 13. Your health care provider may recommend screening at age 68 if you are at increased risk. You will have tests every 1-10 years, depending on your results and the type of screening test.  Diabetes screening. This is done by checking your blood sugar (glucose) after you have not eaten  for a while (fasting). You may have this done every 1-3 years.  Mammogram. This may be done every 1-2 years. Talk with your health care provider about how often you should have regular mammograms.  BRCA-related cancer screening. This may be done if you have a family history of breast, ovarian, tubal, or peritoneal cancers. Other tests  Sexually transmitted disease (STD) testing.  Bone density scan. This is done to screen for osteoporosis. You may have this done starting at age 68. Follow these instructions at home: Eating and drinking  Eat a diet that includes fresh fruits and vegetables, whole grains, lean protein, and low-fat dairy products. Limit your intake of foods with high amounts of sugar, saturated fats, and salt.  Take vitamin and mineral supplements as recommended by your health care provider.  Do not drink alcohol if your health care provider tells you not to drink.  If you drink alcohol: ? Limit how much you have to 0-1 drink a day. ? Be aware of how much alcohol is in your drink. In the [place] one drink equals one 12 oz bottle of beer (355 mL), one 5 oz glass of wine (148 mL), or one 1 oz glass of hard liquor (44 mL). Lifestyle  Take daily care of your teeth and gums.  Stay active. Exercise for at least 30 minutes on 5 or more days each week.  Do not use any products that contain nicotine or tobacco, such as cigarettes, e-cigarettes, and chewing tobacco. If you need help quitting, ask your health care provider.  If you are sexually active, practice safe sex. Use a condom or other form of protection in order to prevent STIs (sexually transmitted infections).  Talk with your health care provider about taking a low-dose aspirin or statin. What's next?  Go to your health care provider once a year for a well check visit.  Ask your health care provider how often you should have your eyes and teeth checked.  Stay up to date on all vaccines. This information is not  intended to replace advice given to you by your health care provider. Make sure you discuss any questions you have with your health care provider. Document Revised: 10/17/2018 Document Reviewed: 10/17/2018 Elsevier Patient Education  2020 [employer].

## 2020-09-28 LAB — COMPREHENSIVE METABOLIC PANEL
ALT: 11 U/L (ref 0–35)
AST: 17 U/L (ref 0–37)
Albumin: 4.3 g/dL (ref 3.5–5.2)
Alkaline Phosphatase: 56 U/L (ref 39–117)
BUN: 23 mg/dL (ref 6–23)
CO2: 28 mEq/L (ref 19–32)
Calcium: 8.8 mg/dL (ref 8.4–10.5)
Chloride: 105 mEq/L (ref 96–112)
Creatinine, Ser: 1.34 mg/dL — ABNORMAL HIGH (ref 0.40–1.20)
GFR: 40.85 mL/min — ABNORMAL LOW (ref >60.00)
Glucose, Bld: 88 mg/dL (ref 70–99)
Potassium: 4.2 mEq/L (ref 3.5–5.1)
Sodium: 140 mEq/L (ref 135–145)
Total Bilirubin: 0.3 mg/dL (ref 0.2–1.2)
Total Protein: 7.1 g/dL (ref 6.0–8.3)

## 2020-09-28 LAB — CBC WITH DIFFERENTIAL/PLATELET
Basophils Absolute: 0 10*3/uL (ref 0.0–0.1)
Basophils Relative: 0.7 % (ref 0.0–3.0)
Eosinophils Absolute: 0.1 10*3/uL (ref 0.0–0.7)
Eosinophils Relative: 1.9 % (ref 0.0–5.0)
HCT: 36.3 % (ref 36.0–46.0)
Hemoglobin: 11.9 g/dL — ABNORMAL LOW (ref 12.0–15.0)
Lymphocytes Relative: 24.8 % (ref 12.0–46.0)
Lymphs Abs: 0.7 10*3/uL (ref 0.7–4.0)
MCHC: 32.7 g/dL (ref 30.0–36.0)
MCV: 92.3 fl (ref 78.0–100.0)
Monocytes Absolute: 0.2 10*3/uL (ref 0.1–1.0)
Monocytes Relative: 8 % (ref 3.0–12.0)
Neutro Abs: 1.9 10*3/uL (ref 1.4–7.7)
Neutrophils Relative %: 64.6 % (ref 43.0–77.0)
Platelets: 212 10*3/uL (ref 150.0–400.0)
RBC: 3.93 Mil/uL (ref 3.87–5.11)
RDW: 13.7 % (ref 11.5–15.5)
WBC: 3 10*3/uL — ABNORMAL LOW (ref 4.0–10.5)

## 2020-09-28 LAB — LIPID PANEL
Cholesterol: 193 mg/dL (ref 0–200)
HDL: 52.5 mg/dL (ref >39.00)
LDL Cholesterol: 113 mg/dL — ABNORMAL HIGH (ref 0–99)
NonHDL: 140.34
Total CHOL/HDL Ratio: 4
Triglycerides: 136 mg/dL (ref 0.0–149.0)
VLDL: 27.2 mg/dL (ref 0.0–40.0)

## 2020-09-28 LAB — TSH: TSH: 2.35 u[IU]/mL (ref 0.35–4.50)

## 2020-09-29 ENCOUNTER — Other Ambulatory Visit: Payer: Self-pay

## 2020-09-29 DIAGNOSIS — C210 Malignant neoplasm of anus, unspecified: Secondary | ICD-10-CM

## 2020-09-29 DIAGNOSIS — R7989 Other specified abnormal findings of blood chemistry: Secondary | ICD-10-CM

## 2020-10-11 ENCOUNTER — Telehealth: Payer: Self-pay | Admitting: Nurse Practitioner

## 2020-10-11 NOTE — Telephone Encounter (Signed)
Called pt son per 12/3 sch msg - left message for patient with appt date and time

## 2020-10-13 ENCOUNTER — Ambulatory Visit: Payer: Medicare HMO

## 2020-10-22 ENCOUNTER — Inpatient Hospital Stay: Payer: Medicare HMO | Attending: Nurse Practitioner | Admitting: Nurse Practitioner

## 2021-06-16 ENCOUNTER — Encounter: Payer: Medicare HMO | Admitting: Physician Assistant

## 2021-07-21 ENCOUNTER — Encounter: Payer: Self-pay | Admitting: Gastroenterology

## 2021-09-26 ENCOUNTER — Other Ambulatory Visit: Payer: Self-pay

## 2021-09-26 ENCOUNTER — Ambulatory Visit (INDEPENDENT_AMBULATORY_CARE_PROVIDER_SITE_OTHER): Payer: Medicare HMO | Admitting: Physician Assistant

## 2021-09-26 VITALS — BP 161/69 | HR 74 | Temp 98.1°F | Ht 61.0 in | Wt 135.2 lb

## 2021-09-26 DIAGNOSIS — Z1211 Encounter for screening for malignant neoplasm of colon: Secondary | ICD-10-CM

## 2021-09-26 DIAGNOSIS — Z131 Encounter for screening for diabetes mellitus: Secondary | ICD-10-CM

## 2021-09-26 DIAGNOSIS — K5909 Other constipation: Secondary | ICD-10-CM | POA: Diagnosis not present

## 2021-09-26 DIAGNOSIS — R030 Elevated blood-pressure reading, without diagnosis of hypertension: Secondary | ICD-10-CM | POA: Diagnosis not present

## 2021-09-26 DIAGNOSIS — Z13220 Encounter for screening for lipoid disorders: Secondary | ICD-10-CM | POA: Diagnosis not present

## 2021-09-26 DIAGNOSIS — D508 Other iron deficiency anemias: Secondary | ICD-10-CM | POA: Diagnosis not present

## 2021-09-26 DIAGNOSIS — C210 Malignant neoplasm of anus, unspecified: Secondary | ICD-10-CM

## 2021-09-26 LAB — COMPREHENSIVE METABOLIC PANEL
ALT: 13 U/L (ref 0–35)
AST: 19 U/L (ref 0–37)
Albumin: 4.2 g/dL (ref 3.5–5.2)
Alkaline Phosphatase: 52 U/L (ref 39–117)
BUN: 24 mg/dL — ABNORMAL HIGH (ref 6–23)
CO2: 27 mEq/L (ref 19–32)
Calcium: 9.4 mg/dL (ref 8.4–10.5)
Chloride: 108 mEq/L (ref 96–112)
Creatinine, Ser: 0.99 mg/dL (ref 0.40–1.20)
GFR: 58.34 mL/min — ABNORMAL LOW (ref >60.00)
Glucose, Bld: 95 mg/dL (ref 70–99)
Potassium: 5 mEq/L (ref 3.5–5.1)
Sodium: 142 mEq/L (ref 135–145)
Total Bilirubin: 0.4 mg/dL (ref 0.2–1.2)
Total Protein: 7.1 g/dL (ref 6.0–8.3)

## 2021-09-26 LAB — LIPID PANEL
Cholesterol: 187 mg/dL (ref 0–200)
HDL: 52.7 mg/dL (ref >39.00)
LDL Cholesterol: 117 mg/dL — ABNORMAL HIGH (ref 0–99)
NonHDL: 134.56
Total CHOL/HDL Ratio: 4
Triglycerides: 89 mg/dL (ref 0.0–149.0)
VLDL: 17.8 mg/dL (ref 0.0–40.0)

## 2021-09-26 LAB — CBC WITH DIFFERENTIAL/PLATELET
Basophils Absolute: 0 10*3/uL (ref 0.0–0.1)
Basophils Relative: 0.6 % (ref 0.0–3.0)
Eosinophils Absolute: 0.1 10*3/uL (ref 0.0–0.7)
Eosinophils Relative: 2.2 % (ref 0.0–5.0)
HCT: 37.4 % (ref 36.0–46.0)
Hemoglobin: 12 g/dL (ref 12.0–15.0)
Lymphocytes Relative: 32.2 % (ref 12.0–46.0)
Lymphs Abs: 0.8 10*3/uL (ref 0.7–4.0)
MCHC: 32.2 g/dL (ref 30.0–36.0)
MCV: 93.4 fl (ref 78.0–100.0)
Monocytes Absolute: 0.3 10*3/uL (ref 0.1–1.0)
Monocytes Relative: 10.5 % (ref 3.0–12.0)
Neutro Abs: 1.3 10*3/uL — ABNORMAL LOW (ref 1.4–7.7)
Neutrophils Relative %: 54.5 % (ref 43.0–77.0)
Platelets: 207 10*3/uL (ref 150.0–400.0)
RBC: 4 Mil/uL (ref 3.87–5.11)
RDW: 13.2 % (ref 11.5–15.5)
WBC: 2.5 10*3/uL — ABNORMAL LOW (ref 4.0–10.5)

## 2021-09-26 LAB — HEMOGLOBIN A1C: Hgb A1c MFr Bld: 5.8 % (ref 4.6–6.5)

## 2021-09-26 NOTE — Patient Instructions (Addendum)
Good to meet you today! Please go to the lab for blood work and I will send results through [place].  Please monitor your blood pressure at home and bring log of readings to the next appointment.  Low salt diet. Drink plenty of water - 64 to 80 ounces daily.   Referral back to GI - need to update colonoscopy

## 2021-09-26 NOTE — Progress Notes (Signed)
Subjective:    Patient ID: Anna Lawrence, female    DOB: January 05, 1951, 70 y.o.   MRN: [id number]  Chief Complaint  Patient presents with   Establish Care    HPI 70 y.o. patient presents today for new patient establishment with me.  Patient was previously established with [name], PA-C.  Current Care Team: None this year   Acute Concerns: Changes in bowel movements - feels more constipated lately & has had some episodes of BRBPR when stool is hard; hx of anal cancer (in remission), last colonoscopy 03/08/2018  Chronic Concerns: See PMH listed below, as well as A/P for details on issues we specifically discussed during today's visit.      Past Medical History:  Diagnosis Date   Anal cancer ([place])    Chicken pox     Past Surgical History:  Procedure Laterality Date   IR FLUORO GUIDE CV LINE RIGHT  02/19/2017   IR GENERIC HISTORICAL  01/22/2017   IR US GUIDE VASC ACCESS RIGHT 01/22/2017 Corrie Mckusick, DO [facility]   IR GENERIC HISTORICAL  01/22/2017   IR FLUORO GUIDE CV LINE RIGHT 01/22/2017 Corrie Mckusick, DO [facility]   IR US GUIDE VASC ACCESS RIGHT  02/19/2017   TUBAL LIGATION      Family History  Problem Relation Age of Onset   Diabetes Mother    Hypertension Mother    Colon polyps Neg Hx    Rectal cancer Neg Hx     Social History   Tobacco Use   Smoking status: Never   Smokeless tobacco: Never  Vaping Use   Vaping Use: Never used  Substance Use Topics   Alcohol use: No    Alcohol/week: 0.0 standard drinks   Drug use: No     No Known Allergies  Review of Systems NEGATIVE UNLESS OTHERWISE INDICATED IN HPI      Objective:     BP (!) 161/69   Pulse 74   Temp 98.1 F (36.7 C)   Ht 5\' 1"  (1.549 m)   Wt 135 lb 3.2 oz (61.3 kg)   SpO2 98%   BMI 25.55 kg/m   Wt Readings from Last 3 Encounters:  09/26/21 135 lb 3.2 oz (61.3 kg)  09/27/20 135 lb (61.2 kg)  08/20/19 133 lb (60.3 kg)    BP Readings from Last 3 Encounters:  09/26/21 (!)  161/69  09/27/20 118/80  08/20/19 100/78     Physical Exam Vitals and nursing note reviewed.  Constitutional:      Appearance: Normal appearance. She is normal weight. She is not toxic-appearing.  HENT:     Head: Normocephalic and atraumatic.     Right Ear: Tympanic membrane, ear canal and external ear normal.     Left Ear: Tympanic membrane, ear canal and external ear normal.     Nose: Nose normal.     Mouth/Throat:     Mouth: Mucous membranes are moist.  Eyes:     Extraocular Movements: Extraocular movements intact.     Conjunctiva/sclera: Conjunctivae normal.     Pupils: Pupils are equal, round, and reactive to light.  Cardiovascular:     Rate and Rhythm: Normal rate and regular rhythm.     Pulses: Normal pulses.     Heart sounds: Normal heart sounds.  Pulmonary:     Effort: Pulmonary effort is normal.     Breath sounds: Normal breath sounds.  Abdominal:     General: Abdomen is flat. Bowel sounds are normal.  Palpations: Abdomen is soft.  Musculoskeletal:        General: Normal range of motion.     Cervical back: Normal range of motion and neck supple.  Skin:    General: Skin is warm and dry.  Neurological:     General: No focal deficit present.     Mental Status: She is alert and oriented to person, place, and time.  Psychiatric:        Mood and Affect: Mood normal.        Behavior: Behavior normal.        Thought Content: Thought content normal.        Judgment: Judgment normal.       Assessment & Plan:   Problem List Items Addressed This Visit       Digestive   Anal cancer [facility])   Relevant Orders   Ambulatory referral to Gastroenterology   CBC with Differential/Platelet   Comprehensive metabolic panel   Other Visit Diagnoses     Screening for colon cancer    -  Primary   Relevant Orders   Ambulatory referral to Gastroenterology   CBC with Differential/Platelet   Other constipation       Relevant Orders   Ambulatory referral to  Gastroenterology   Other iron deficiency anemia       Relevant Orders   CBC with Differential/Platelet   Elevated blood pressure reading       Relevant Orders   Comprehensive metabolic panel   Diabetes mellitus screening       Relevant Orders   Comprehensive metabolic panel   Hemoglobin A1c   Screening for cholesterol level       Relevant Orders   Lipid panel       PLAN: -Refer back to GI, need to update colonoscopy -Labs today, will call with results and treat accordingly -BP monitoring - see AVS. May need to consider antihypertensive if continuing to trend high. -F/up in 4-6 weeks on this  Jd Mccaster M Michal Strzelecki, PA-C

## 2021-10-24 ENCOUNTER — Ambulatory Visit: Payer: Medicare HMO | Admitting: Physician Assistant

## 2021-12-22 ENCOUNTER — Other Ambulatory Visit: Payer: Self-pay

## 2021-12-22 ENCOUNTER — Ambulatory Visit (INDEPENDENT_AMBULATORY_CARE_PROVIDER_SITE_OTHER): Payer: Medicare HMO | Admitting: Physician Assistant

## 2021-12-22 VITALS — BP 148/87 | HR 74 | Temp 98.2°F | Ht 61.0 in | Wt 133.5 lb

## 2021-12-22 DIAGNOSIS — D72819 Decreased white blood cell count, unspecified: Secondary | ICD-10-CM

## 2021-12-22 DIAGNOSIS — C210 Malignant neoplasm of anus, unspecified: Secondary | ICD-10-CM

## 2021-12-22 DIAGNOSIS — I10 Essential (primary) hypertension: Secondary | ICD-10-CM

## 2021-12-22 DIAGNOSIS — Z23 Encounter for immunization: Secondary | ICD-10-CM | POA: Diagnosis not present

## 2021-12-22 DIAGNOSIS — D508 Other iron deficiency anemias: Secondary | ICD-10-CM

## 2021-12-22 DIAGNOSIS — F4321 Adjustment disorder with depressed mood: Secondary | ICD-10-CM

## 2021-12-22 DIAGNOSIS — K921 Melena: Secondary | ICD-10-CM

## 2021-12-22 DIAGNOSIS — K5909 Other constipation: Secondary | ICD-10-CM

## 2021-12-22 LAB — CBC WITH DIFFERENTIAL/PLATELET
Basophils Absolute: 0 10*3/uL (ref 0.0–0.1)
Basophils Relative: 0.6 % (ref 0.0–3.0)
Eosinophils Absolute: 0.1 10*3/uL (ref 0.0–0.7)
Eosinophils Relative: 2.3 % (ref 0.0–5.0)
HCT: 36.5 % (ref 36.0–46.0)
Hemoglobin: 11.9 g/dL — ABNORMAL LOW (ref 12.0–15.0)
Lymphocytes Relative: 31.6 % (ref 12.0–46.0)
Lymphs Abs: 0.8 10*3/uL (ref 0.7–4.0)
MCHC: 32.7 g/dL (ref 30.0–36.0)
MCV: 92.3 fl (ref 78.0–100.0)
Monocytes Absolute: 0.2 10*3/uL (ref 0.1–1.0)
Monocytes Relative: 8.9 % (ref 3.0–12.0)
Neutro Abs: 1.5 10*3/uL (ref 1.4–7.7)
Neutrophils Relative %: 56.6 % (ref 43.0–77.0)
Platelets: 215 10*3/uL (ref 150.0–400.0)
RBC: 3.95 Mil/uL (ref 3.87–5.11)
RDW: 12.9 % (ref 11.5–15.5)
WBC: 2.7 10*3/uL — ABNORMAL LOW (ref 4.0–10.5)

## 2021-12-22 MED ORDER — DOCUSATE SODIUM 100 MG PO CAPS: 100.0000 mg | ORAL_CAPSULE | Freq: Two times a day (BID) | ORAL | 0 refills | Status: DC

## 2021-12-22 MED ORDER — LOSARTAN POTASSIUM 25 MG PO TABS: 25.0000 mg | ORAL_TABLET | Freq: Every day | ORAL | 2 refills | Status: DC

## 2021-12-22 MED ORDER — POLYETHYLENE GLYCOL 3350 17 G PO PACK: 17.0000 g | PACK | Freq: Every day | ORAL | 2 refills | Status: AC

## 2021-12-22 NOTE — Progress Notes (Signed)
Subjective:    Patient ID: Anna Lawrence, female    DOB: [birth year], 71 y.o.   MRN: [id number]  Chief Complaint  Patient presents with   Follow-up   Retal pain    HPI Patient is in today for f/up from 09/26/21. She is here with her son, Gershon Mussel, to help with translating.   Since last visit, her husband passed away just after Christmas. She is tearful on exam and still grieving his loss.   She has also been struggling with constipation more recently. Hx of anal cancer - treated with radiation and chemotherapy. Complains of rectal pain with bowel movements, some BRBPR, constipation. Has not seen GI in awhile. Does not take anything for constipation.   Desires flu shot today.  Has not been checking blood pressure at home.   Past Medical History:  Diagnosis Date   Anal cancer ([place])    Chicken pox     Past Surgical History:  Procedure Laterality Date   IR FLUORO GUIDE CV LINE RIGHT  02/19/2017   IR GENERIC HISTORICAL  01/22/2017   IR US GUIDE VASC ACCESS RIGHT 01/22/2017 Corrie Mckusick, DO [facility]   IR GENERIC HISTORICAL  01/22/2017   IR FLUORO GUIDE CV LINE RIGHT 01/22/2017 Corrie Mckusick, DO [facility]   IR US GUIDE VASC ACCESS RIGHT  02/19/2017   TUBAL LIGATION      Family History  Problem Relation Age of Onset   Diabetes Mother    Hypertension Mother    Colon polyps Neg Hx    Rectal cancer Neg Hx     Social History   Tobacco Use   Smoking status: Never   Smokeless tobacco: Never  Vaping Use   Vaping Use: Never used  Substance Use Topics   Alcohol use: No    Alcohol/week: 0.0 standard drinks   Drug use: No     No Known Allergies  Review of Systems NEGATIVE UNLESS OTHERWISE INDICATED IN HPI      Objective:     BP (!) 148/87    Pulse 74    Temp 98.2 F (36.8 C)    Ht 5\' 1"  (1.549 m)    Wt 133 lb 8 oz (60.6 kg)    SpO2 100%    BMI 25.22 kg/m   Wt Readings from Last 3 Encounters:  12/22/21 133 lb 8 oz (60.6 kg)  09/26/21 135 lb 3.2 oz (61.3 kg)   09/27/20 135 lb (61.2 kg)    BP Readings from Last 3 Encounters:  12/22/21 (!) 148/87  09/26/21 (!) 161/69  09/27/20 118/80     Physical Exam Vitals and nursing note reviewed.  Constitutional:      Appearance: Normal appearance. She is normal weight. She is not toxic-appearing.  HENT:     Head: Normocephalic and atraumatic.     Right Ear: External ear normal.     Left Ear: External ear normal.     Nose: Nose normal.     Mouth/Throat:     Mouth: Mucous membranes are moist.  Eyes:     Extraocular Movements: Extraocular movements intact.     Conjunctiva/sclera: Conjunctivae normal.     Pupils: Pupils are equal, round, and reactive to light.  Cardiovascular:     Rate and Rhythm: Normal rate and regular rhythm.     Pulses: Normal pulses.     Heart sounds: Normal heart sounds.  Pulmonary:     Effort: Pulmonary effort is normal.     Breath sounds: Normal breath  sounds.  Musculoskeletal:        General: Normal range of motion.     Cervical back: Normal range of motion and neck supple.  Skin:    General: Skin is warm and dry.  Neurological:     General: No focal deficit present.     Mental Status: She is alert and oriented to person, place, and time.  Psychiatric:        Behavior: Behavior normal.        Thought Content: Thought content normal.        Judgment: Judgment normal.     Comments: Tearful        Assessment & Plan:   Problem List Items Addressed This Visit       Digestive   Anal cancer [facility])   Relevant Orders   Ambulatory referral to Gastroenterology   Other Visit Diagnoses     Essential hypertension    -  Primary   Relevant Medications   losartan (COZAAR) 25 MG tablet   Other iron deficiency anemia       Relevant Orders   CBC with Differential/Platelet   Leukopenia, unspecified type       Relevant Orders   CBC with Differential/Platelet   Feeling grief       Relevant Orders   Ambulatory referral to Psychology   Other constipation        Relevant Orders   Ambulatory referral to Gastroenterology   Blood in stool       Relevant Orders   Ambulatory referral to Gastroenterology   Need for immunization against influenza       Relevant Orders   Flu Vaccine QUAD High Dose(Fluad) (Completed)        Meds ordered this encounter  Medications   losartan (COZAAR) 25 MG tablet    Sig: Take 1 tablet (25 mg total) by mouth daily. To help lower blood pressure.    Dispense:  30 tablet    Refill:  2   docusate sodium (COLACE) 100 MG capsule    Sig: Take 1 capsule (100 mg total) by mouth 2 (two) times daily.    Dispense:  60 capsule    Refill:  0   polyethylene glycol (MIRALAX / GLYCOLAX) 17 g packet    Sig: Take 17 g by mouth daily.    Dispense:  14 each    Refill:  2   1. Essential hypertension -Not to goal, hasn't been checking at home and I encouraged her to do this -Start on losartan 25 mg daily -Limit salt in diet   2. Other iron deficiency anemia 3. Leukopenia, unspecified type -Recheck CBC today   4. Feeling grief -Referral for grief counseling due to very recent loss of husband  5. Other constipation 6. Blood in stool 7. Anal cancer ([place]) -Will start on stool softeners and Miralax -Referral resent to GI, she needs to f/up with them urgently given her history  8. Need for immunization against influenza Updated vaccination today   This note was prepared with assistance of Dragon voice recognition software. Occasional wrong-word or sound-a-like substitutions may have occurred due to the inherent limitations of voice recognition software.  Time Spent: 35 minutes of total time was spent on the date of the encounter performing the following actions: chart review prior to seeing the patient, obtaining history, performing a medically necessary exam, counseling on the treatment plan, placing orders, and documenting in our EHR.    Sherry Rogus M Henok Heacock, PA-C

## 2021-12-22 NOTE — Patient Instructions (Signed)
Good to see you today! Please have labs checked today.  I have sent referrals for grief counseling and GI.  Please take medications as directed.  Monitor your blood pressure at home. Call if any concerns.

## 2021-12-23 ENCOUNTER — Other Ambulatory Visit: Payer: Self-pay

## 2021-12-23 DIAGNOSIS — F4321 Adjustment disorder with depressed mood: Secondary | ICD-10-CM

## 2022-01-23 ENCOUNTER — Other Ambulatory Visit: Payer: Self-pay | Admitting: Physician Assistant

## 2022-02-24 ENCOUNTER — Encounter: Payer: Self-pay | Admitting: Physician Assistant

## 2022-03-05 ENCOUNTER — Other Ambulatory Visit: Payer: Self-pay | Admitting: Physician Assistant

## 2022-03-27 ENCOUNTER — Ambulatory Visit (INDEPENDENT_AMBULATORY_CARE_PROVIDER_SITE_OTHER): Payer: Medicare HMO | Admitting: Physician Assistant

## 2022-03-27 VITALS — BP 132/83 | HR 80 | Temp 97.6°F | Ht 61.0 in | Wt 130.8 lb

## 2022-03-27 DIAGNOSIS — C210 Malignant neoplasm of anus, unspecified: Secondary | ICD-10-CM | POA: Diagnosis not present

## 2022-03-27 DIAGNOSIS — I10 Essential (primary) hypertension: Secondary | ICD-10-CM | POA: Diagnosis not present

## 2022-03-27 DIAGNOSIS — D508 Other iron deficiency anemias: Secondary | ICD-10-CM | POA: Diagnosis not present

## 2022-03-27 DIAGNOSIS — F4321 Adjustment disorder with depressed mood: Secondary | ICD-10-CM

## 2022-03-27 DIAGNOSIS — D649 Anemia, unspecified: Secondary | ICD-10-CM | POA: Insufficient documentation

## 2022-03-27 NOTE — Assessment & Plan Note (Signed)
   Denies current issues (most likely last visit she was having constipation causing some minor bleeding, resolved since).   She is going to f/up with GI, interpreter is going to help set up this appointment

## 2022-03-27 NOTE — Progress Notes (Signed)
Subjective:    Patient ID: Anna Lawrence, female    DOB: November 03, 1951, 71 y.o.   MRN: [id number]  Chief Complaint  Patient presents with   Follow-up   Hypertension    Pt being seen for 3 mon f/u for hypertension; Pt husband passed away in 10/24/23 and pt still having issues dealing with his passing. Pt getting info on where to call for mammogram scheduling today; also needs to schedule Colonoscopy and Dexa scan    Patient is in today for f/up from last visit.  Due to language barrier, an interpreter was present during the history-taking and subsequent discussion (and for part of the physical exam) with this patient.  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 Havery Moros When she came last time was seeing blood in stool, but also says she was constipated (also grieving very recent loss of husband at that visit). No blood in stool, regular bowel movements now No dizziness or headaches, no night sweats, no major unintentional weight loss   Past Medical History:  Diagnosis Date   Anal cancer ([place])    Chicken pox     Past Surgical History:  Procedure Laterality Date   IR FLUORO GUIDE CV LINE RIGHT  02/19/2017   IR GENERIC HISTORICAL  01/22/2017   IR US GUIDE VASC ACCESS RIGHT 01/22/2017 Corrie Mckusick, DO [facility]   IR GENERIC HISTORICAL  01/22/2017   IR FLUORO GUIDE CV LINE RIGHT 01/22/2017 Corrie Mckusick, DO [facility]   IR US GUIDE VASC ACCESS RIGHT  02/19/2017   TUBAL LIGATION      Family History  Problem Relation Age of Onset   Diabetes Mother    Hypertension Mother    Colon polyps Neg Hx     Rectal cancer Neg Hx     Social History   Tobacco Use   Smoking status: Never   Smokeless tobacco: Never  Vaping Use   Vaping Use: Never used  Substance Use Topics   Alcohol use: No    Alcohol/week: 0.0 standard drinks   Drug use: No     No Known Allergies  Review of Systems NEGATIVE UNLESS OTHERWISE INDICATED IN HPI      Objective:     BP 132/83 (BP Location: Right Arm)   Pulse 80   Temp 97.6 F (36.4 C) (Temporal)   Ht '5\' 1"'$  (1.549 m)   Wt 130 lb 12.8 oz (59.3 kg)   SpO2 98%   BMI 24.71 kg/m   Wt Readings from Last 3 Encounters:  03/27/22 130 lb 12.8 oz (59.3 kg)  12/22/21 133 lb 8 oz (60.6 kg)  09/26/21 135 lb 3.2 oz (61.3 kg)    BP Readings from Last 3 Encounters:  03/27/22 132/83  12/22/21 (!) 148/87  09/26/21 (!) 161/69     Physical Exam Vitals and nursing note reviewed.  Constitutional:      Appearance: Normal appearance. She is normal weight. She is not toxic-appearing.  HENT:     Head: Normocephalic and atraumatic.     Right Ear: External ear normal.  Left Ear: External ear normal.     Nose: Nose normal.     Mouth/Throat:     Mouth: Mucous membranes are moist.  Eyes:     Extraocular Movements: Extraocular movements intact.     Conjunctiva/sclera: Conjunctivae normal.     Pupils: Pupils are equal, round, and reactive to light.  Cardiovascular:     Rate and Rhythm: Normal rate and regular rhythm.     Pulses: Normal pulses.     Heart sounds: Normal heart sounds.  Pulmonary:     Effort: Pulmonary effort is normal.     Breath sounds: Normal breath sounds.  Musculoskeletal:        General: Normal range of motion.     Cervical back: Normal range of motion and neck supple.  Skin:    General: Skin is warm and dry.  Neurological:     General: No focal deficit present.     Mental Status: She is alert and oriented to person, place, and time.  Psychiatric:        Behavior: Behavior normal.        Thought Content: Thought content normal.         Judgment: Judgment normal.     Comments: Tearful at times, but doing better overall today        Assessment & Plan:   Problem List Items Addressed This Visit       Cardiovascular and Mediastinum   Essential hypertension - Primary     Digestive   Anal cancer ([place])     Other   Absolute anemia   Feeling grief     No orders of the defined types were placed in this encounter.    Return in about 3 months (around 06/27/2022) for recheck, follow-up.  This note was prepared with assistance of Systems analyst. Occasional wrong-word or sound-a-like substitutions may have occurred due to the inherent limitations of voice recognition software.    Ammie Warrick M Carlis Blanchard, PA-C

## 2022-03-27 NOTE — Assessment & Plan Note (Signed)
   No bleeding concerns  Monitor, recheck CBC next visit  May consider iron supplement Lab Results  Component Value Date   WBC 2.7 (L) 12/22/2021   HGB 11.9 (L) 12/22/2021   HCT 36.5 12/22/2021   MCV 92.3 12/22/2021   PLT 215.0 12/22/2021

## 2022-03-27 NOTE — Assessment & Plan Note (Signed)
   Still tearful, does not want medication or counseling at this time  Plans to go out with son and new friends to help

## 2022-03-27 NOTE — Patient Instructions (Addendum)
Please call Anna Lawrence to schedule your appointment [phone]  Interpreter is going to help get bone density and mammo scheduled - thank you!  Keep on losartan 25 mg daily - BP looks great!  Call / come back anytime! Happy to see you always!

## 2022-03-27 NOTE — Assessment & Plan Note (Signed)
   Losartan 25 mg daily, well-controlled, continue this medication  Monitor at home if able

## 2022-06-02 ENCOUNTER — Other Ambulatory Visit: Payer: Self-pay | Admitting: Physician Assistant

## 2022-06-19 ENCOUNTER — Ambulatory Visit (INDEPENDENT_AMBULATORY_CARE_PROVIDER_SITE_OTHER): Payer: Medicare HMO | Admitting: Physician Assistant

## 2022-06-19 ENCOUNTER — Encounter: Payer: Self-pay | Admitting: Physician Assistant

## 2022-06-19 VITALS — BP 126/80 | HR 67 | Temp 97.0°F | Ht 61.0 in | Wt 126.4 lb

## 2022-06-19 DIAGNOSIS — D508 Other iron deficiency anemias: Secondary | ICD-10-CM | POA: Diagnosis not present

## 2022-06-19 DIAGNOSIS — R1084 Generalized abdominal pain: Secondary | ICD-10-CM | POA: Diagnosis not present

## 2022-06-19 DIAGNOSIS — C210 Malignant neoplasm of anus, unspecified: Secondary | ICD-10-CM | POA: Diagnosis not present

## 2022-06-19 DIAGNOSIS — I10 Essential (primary) hypertension: Secondary | ICD-10-CM | POA: Diagnosis not present

## 2022-06-19 DIAGNOSIS — D72819 Decreased white blood cell count, unspecified: Secondary | ICD-10-CM

## 2022-06-19 NOTE — Progress Notes (Signed)
Subjective:    Patient ID: Anna Lawrence, female    DOB: Aug 12, 1951, 71 y.o.   MRN: [id number]  Chief Complaint  Patient presents with   Follow-up    Pt in for 3 mon f/u; pt states all is well, has slight stomach pain at times due to constipation, pt is still having daily bm but not normal and hard to go; has the urge to go but nothing happens; pt needing order for Colonoscopy that is over due.    HPI Patient is in today for 3 month f/up. She is here with her son, Gershon Mussel, who helps with translation today. States the only concern is - bowel movement issues especially in the last 2 weeks. Eating smaller meals due to feeling constipated and having stomach pains. No diarrhea, no blood in stool. No nausea or vomiting. No dizzy or lightheaded. Previously tried Miralax, but made her use the bathroom too often; hasn't taken it since. Energy seems normal and she's doing her usual gardening.   Taking Losartan 25 mg daily for BP w/o issues.   Past Medical History:  Diagnosis Date   Anal cancer ([place])    Chicken pox     Past Surgical History:  Procedure Laterality Date   IR FLUORO GUIDE CV LINE RIGHT  02/19/2017   IR GENERIC HISTORICAL  01/22/2017   IR US GUIDE VASC ACCESS RIGHT 01/22/2017 Corrie Mckusick, DO [facility]   IR GENERIC HISTORICAL  01/22/2017   IR FLUORO GUIDE CV LINE RIGHT 01/22/2017 Corrie Mckusick, DO [facility]   IR US GUIDE VASC ACCESS RIGHT  02/19/2017   TUBAL LIGATION      Family History  Problem Relation Age of Onset   Diabetes Mother    Hypertension Mother    Colon polyps Neg Hx    Rectal cancer Neg Hx     Social History   Tobacco Use   Smoking status: Never   Smokeless tobacco: Never  Vaping Use   Vaping Use: Never used  Substance Use Topics   Alcohol use: No    Alcohol/week: 0.0 standard drinks of alcohol   Drug use: No     No Known Allergies  Review of Systems NEGATIVE UNLESS OTHERWISE INDICATED IN HPI      Objective:     BP 126/80 (BP  Location: Left Arm)   Pulse 67   Temp (!) 97 F (36.1 C) (Temporal)   Ht '5\' 1"'$  (1.549 m)   Wt 126 lb 6.4 oz (57.3 kg)   SpO2 98%   BMI 23.88 kg/m   Wt Readings from Last 3 Encounters:  06/19/22 126 lb 6.4 oz (57.3 kg)  03/27/22 130 lb 12.8 oz (59.3 kg)  12/22/21 133 lb 8 oz (60.6 kg)    BP Readings from Last 3 Encounters:  06/19/22 126/80  03/27/22 132/83  12/22/21 (!) 148/87     Physical Exam Vitals and nursing note reviewed.  Constitutional:      Appearance: Normal appearance. She is normal weight. She is not toxic-appearing.  Eyes:     Extraocular Movements: Extraocular movements intact.     Conjunctiva/sclera: Conjunctivae normal.     Pupils: Pupils are equal, round, and reactive to light.  Cardiovascular:     Rate and Rhythm: Normal rate and regular rhythm.     Pulses: Normal pulses.     Heart sounds: Normal heart sounds.  Pulmonary:     Effort: Pulmonary effort is normal.     Breath sounds: Normal breath sounds.  Abdominal:     General: Abdomen is flat. Bowel sounds are normal.     Palpations: Abdomen is soft.  Musculoskeletal:        General: Normal range of motion.     Cervical back: Normal range of motion and neck supple.  Skin:    General: Skin is warm and dry.  Neurological:     General: No focal deficit present.     Mental Status: She is alert and oriented to person, place, and time.  Psychiatric:        Mood and Affect: Mood normal.        Behavior: Behavior normal.        Assessment & Plan:   Problem List Items Addressed This Visit       Cardiovascular and Mediastinum   Essential hypertension - Primary     Digestive   Anal cancer ([place])   Relevant Orders   DG Abd 2 Views   CBC with Differential/Platelet   Comprehensive metabolic panel   Iron, TIBC and Ferritin Panel     Other   Absolute anemia   Relevant Orders   CBC with Differential/Platelet   Comprehensive metabolic panel   Iron, TIBC and Ferritin Panel   Other Visit  Diagnoses     Generalized abdominal pain       Relevant Orders   DG Abd 2 Views   CBC with Differential/Platelet   Comprehensive metabolic panel   Iron, TIBC and Ferritin Panel   Leukopenia, unspecified type       Relevant Orders   CBC with Differential/Platelet       Plan:  1. Essential hypertension BP stable and at goal, cont Losartan 25 mg daily  2. Generalized abdominal pain 3. Anal cancer ([place]) Hx of anal cancer; has not had f/up with GI - pt has not followed through with referral; new abdominal pain / stool concerns - plan to at least check plain films of abdomen, would have low threshold for CT with her hx. She is agreeable to XRAY this week.   4. Other iron deficiency anemia 5. Leukopenia, unspecified type Need to repeat labs today; treat pending results    Return in about 3 months (around 09/19/2022) for follow-up.  This note was prepared with assistance of Systems analyst. Occasional wrong-word or sound-a-like substitutions may have occurred due to the inherent limitations of voice recognition software.  Time Spent: 31 minutes of total time was spent on the date of the encounter performing the following actions: chart review prior to seeing the patient, obtaining history, performing a medically necessary exam, counseling on the treatment plan, placing orders, and documenting in our EHR.       Jesper Stirewalt M Jeffery Bachmeier, PA-C

## 2022-09-04 ENCOUNTER — Other Ambulatory Visit: Payer: Self-pay | Admitting: Physician Assistant

## 2022-09-18 ENCOUNTER — Ambulatory Visit: Payer: Medicare HMO | Admitting: Physician Assistant

## 2022-10-19 ENCOUNTER — Encounter: Payer: Self-pay | Admitting: *Deleted

## 2022-12-03 ENCOUNTER — Other Ambulatory Visit: Payer: Self-pay | Admitting: Physician Assistant

## 2023-03-06 ENCOUNTER — Other Ambulatory Visit: Payer: Self-pay | Admitting: Physician Assistant

## 2023-06-01 ENCOUNTER — Encounter: Payer: Self-pay | Admitting: Physician Assistant

## 2023-06-01 ENCOUNTER — Ambulatory Visit (INDEPENDENT_AMBULATORY_CARE_PROVIDER_SITE_OTHER): Payer: Medicare HMO | Admitting: Physician Assistant

## 2023-06-01 VITALS — BP 146/90 | HR 69 | Temp 97.6°F | Ht 61.0 in | Wt 130.0 lb

## 2023-06-01 DIAGNOSIS — D72819 Decreased white blood cell count, unspecified: Secondary | ICD-10-CM

## 2023-06-01 DIAGNOSIS — Z0000 Encounter for general adult medical examination without abnormal findings: Secondary | ICD-10-CM | POA: Diagnosis not present

## 2023-06-01 DIAGNOSIS — C210 Malignant neoplasm of anus, unspecified: Secondary | ICD-10-CM | POA: Diagnosis not present

## 2023-06-01 DIAGNOSIS — R7303 Prediabetes: Secondary | ICD-10-CM

## 2023-06-01 DIAGNOSIS — E7800 Pure hypercholesterolemia, unspecified: Secondary | ICD-10-CM | POA: Diagnosis not present

## 2023-06-01 LAB — CBC WITH DIFFERENTIAL/PLATELET
Basophils Absolute: 0 10*3/uL (ref 0.0–0.1)
Basophils Relative: 0.6 % (ref 0.0–3.0)
Eosinophils Absolute: 0.1 10*3/uL (ref 0.0–0.7)
Eosinophils Relative: 2.3 % (ref 0.0–5.0)
HCT: 38.6 % (ref 36.0–46.0)
Hemoglobin: 12.1 g/dL (ref 12.0–15.0)
Lymphocytes Relative: 31.7 % (ref 12.0–46.0)
Lymphs Abs: 0.9 10*3/uL (ref 0.7–4.0)
MCHC: 31.4 g/dL (ref 30.0–36.0)
MCV: 93.9 fl (ref 78.0–100.0)
Monocytes Absolute: 0.3 10*3/uL (ref 0.1–1.0)
Monocytes Relative: 9.3 % (ref 3.0–12.0)
Neutro Abs: 1.6 10*3/uL (ref 1.4–7.7)
Neutrophils Relative %: 56.1 % (ref 43.0–77.0)
Platelets: 238 10*3/uL (ref 150.0–400.0)
RBC: 4.11 Mil/uL (ref 3.87–5.11)
RDW: 13.4 % (ref 11.5–15.5)
WBC: 2.9 10*3/uL — ABNORMAL LOW (ref 4.0–10.5)

## 2023-06-01 LAB — COMPREHENSIVE METABOLIC PANEL
ALT: 14 U/L (ref 0–35)
AST: 17 U/L (ref 0–37)
Albumin: 4.2 g/dL (ref 3.5–5.2)
Alkaline Phosphatase: 51 U/L (ref 39–117)
BUN: 21 mg/dL (ref 6–23)
CO2: 29 mEq/L (ref 19–32)
Calcium: 9.6 mg/dL (ref 8.4–10.5)
Chloride: 104 mEq/L (ref 96–112)
Creatinine, Ser: 1.08 mg/dL (ref 0.40–1.20)
GFR: 51.94 mL/min — ABNORMAL LOW (ref >60.00)
Glucose, Bld: 86 mg/dL (ref 70–99)
Potassium: 4.8 mEq/L (ref 3.5–5.1)
Sodium: 141 mEq/L (ref 135–145)
Total Bilirubin: 0.5 mg/dL (ref 0.2–1.2)
Total Protein: 7.2 g/dL (ref 6.0–8.3)

## 2023-06-01 LAB — LIPID PANEL
Cholesterol: 235 mg/dL — ABNORMAL HIGH (ref 0–200)
HDL: 58 mg/dL (ref >39.00)
LDL Cholesterol: 155 mg/dL — ABNORMAL HIGH (ref 0–99)
NonHDL: 177.4
Total CHOL/HDL Ratio: 4
Triglycerides: 113 mg/dL (ref 0.0–149.0)
VLDL: 22.6 mg/dL (ref 0.0–40.0)

## 2023-06-01 LAB — HEMOGLOBIN A1C: Hgb A1c MFr Bld: 5.9 % (ref 4.6–6.5)

## 2023-06-01 LAB — TSH: TSH: 2.44 u[IU]/mL (ref 0.35–5.50)

## 2023-06-01 NOTE — Patient Instructions (Addendum)
Very good to see you today!  Labs / blood work today  Shingles vaccine (prescription given) to take to the pharmacy  You need to have an updated colonoscopy   It is advised to have an updated mammogram and bone density test done as well.  Keep up good work with healthy lifestyle.

## 2023-06-01 NOTE — Progress Notes (Signed)
Subjective:    Patient ID: Anna Lawrence, female    DOB: Sep 21, 1951, 72 y.o.   MRN: [id number]  Chief Complaint  Patient presents with   Annual Exam    Pt here for Annual Exam and is currently fasting. Pt has not had colonoscopy/mammogram and will need Rx for Shingrix    HPI Patient is in today for annual exam. Here with her son, who helps with interpretation.   Acute concerns: Intermittent bloating, but otherwise says her bowel movements are about back to normal again.  Watery eyes, no other allergy symptoms like sneezing or congestion   Health maintenance: Lifestyle/ exercise: Gardening, working 4 days per week in restaurant Nutrition: No dietary changes  Mental health: Feeling better Sleep: occasionally wakes up during the night, but overall does good  Substance use: none ETOH: none  Sexual activity: not active  Immunizations: needs Shingles Colonoscopy: last done in 2019 (due in 2022)  Pap: no longer needed Mammogram: last done in 2015  DEXA: never done; eats yogurt occasionally     Past Medical History:  Diagnosis Date   Anal cancer ([facility])    Chicken pox     Past Surgical History:  Procedure Laterality Date   IR FLUORO GUIDE CV LINE RIGHT  02/19/2017   IR GENERIC HISTORICAL  01/22/2017   IR US GUIDE VASC ACCESS RIGHT 01/22/2017 Gilmer Mor, DO [facility]   IR GENERIC HISTORICAL  01/22/2017   IR FLUORO GUIDE CV LINE RIGHT 01/22/2017 Gilmer Mor, DO [facility]   IR US GUIDE VASC ACCESS RIGHT  02/19/2017   TUBAL LIGATION      Family History  Problem Relation Age of Onset   Diabetes Mother    Hypertension Mother    Colon polyps Neg Hx    Rectal cancer Neg Hx     Social History   Tobacco Use   Smoking status: Never   Smokeless tobacco: Never  Vaping Use   Vaping status: Never Used  Substance Use Topics   Alcohol use: No    Alcohol/week: 0.0 standard drinks of alcohol   Drug use: No     No Known Allergies  Review of Systems NEGATIVE  UNLESS OTHERWISE INDICATED IN HPI      Objective:     BP (!) 146/90   Pulse 69   Temp 97.6 F (36.4 C)   Ht 5\' 1"  (1.549 m)   Wt 130 lb (59 kg)   SpO2 99%   BMI 24.56 kg/m   Wt Readings from Last 3 Encounters:  06/01/23 130 lb (59 kg)  06/19/22 126 lb 6.4 oz (57.3 kg)  03/27/22 130 lb 12.8 oz (59.3 kg)    BP Readings from Last 3 Encounters:  06/01/23 (!) 146/90  06/19/22 126/80  03/27/22 132/83     Physical Exam Vitals and nursing note reviewed. Exam conducted with a chaperone present.  Constitutional:      Appearance: Normal appearance. She is normal weight. She is not toxic-appearing.  HENT:     Head: Normocephalic and atraumatic.     Right Ear: Tympanic membrane, ear canal and external ear normal.     Left Ear: Tympanic membrane, ear canal and external ear normal.     Nose: Nose normal.     Mouth/Throat:     Mouth: Mucous membranes are moist.  Eyes:     Extraocular Movements: Extraocular movements intact.     Conjunctiva/sclera: Conjunctivae normal.     Pupils: Pupils are equal, round, and reactive to  light.  Cardiovascular:     Rate and Rhythm: Normal rate and regular rhythm.     Pulses: Normal pulses.     Heart sounds: Normal heart sounds.  Pulmonary:     Effort: Pulmonary effort is normal.     Breath sounds: Normal breath sounds.  Chest:  Breasts:    Right: Normal. No mass, nipple discharge, skin change or tenderness.     Left: Normal. No mass, nipple discharge, skin change or tenderness.  Abdominal:     General: Abdomen is flat. Bowel sounds are normal.     Palpations: Abdomen is soft.  Musculoskeletal:        General: Normal range of motion.     Cervical back: Normal range of motion and neck supple.  Lymphadenopathy:     Upper Body:     Right upper body: No supraclavicular or axillary adenopathy.     Left upper body: No supraclavicular or axillary adenopathy.  Skin:    General: Skin is warm and dry.  Neurological:     General: No focal  deficit present.     Mental Status: She is alert and oriented to person, place, and time.  Psychiatric:        Mood and Affect: Mood normal.        Behavior: Behavior normal.        Thought Content: Thought content normal.        Judgment: Judgment normal.        Assessment & Plan:  Encounter for annual physical exam -     Lipid panel -     Comprehensive metabolic panel -     CBC with Differential/Platelet -     Hemoglobin A1c -     TSH  Leukopenia, unspecified type -     CBC with Differential/Platelet  Elevated LDL cholesterol level -     Lipid panel  Prediabetes -     Comprehensive metabolic panel -     Hemoglobin A1c  Anal cancer ([facility]) -     Ambulatory referral to Gastroenterology   Age-appropriate screening and counseling performed today. Will check labs and call with results. Preventive measures discussed and printed in AVS for patient.   Patient Counseling: [x]   Nutrition: Stressed importance of moderation in sodium/caffeine intake, saturated fat and cholesterol, caloric balance, sufficient intake of fresh fruits, vegetables, and fiber.  [x]   Stressed the importance of regular exercise.   []   Substance Abuse: Discussed cessation/primary prevention of tobacco, alcohol, or other drug use; driving or other dangerous activities under the influence; availability of treatment for abuse.   [x]   Injury prevention: Discussed safety belts, safety helmets, smoke detector, smoking near bedding or upholstery.   []   Sexuality: Discussed sexually transmitted diseases, partner selection, use of condoms, avoidance of unintended pregnancy  and contraceptive alternatives.   [x]   Dental health: Discussed importance of regular tooth brushing, flossing, and dental visits.  [x]   Health maintenance and immunizations reviewed. Please refer to Health maintenance section.     Labs / blood work today  Shingles vaccine (prescription given) to take to the pharmacy  You need to have an  updated colonoscopy   It is advised to have an updated mammogram and bone density test done as well.  Keep up good work with healthy lifestyle.    Return in about 6 months (around 12/02/2023) for recheck/follow-up, blood pressure check.    Zackaria Burkey M Emylie Amster, PA-C

## 2023-06-03 ENCOUNTER — Other Ambulatory Visit: Payer: Self-pay | Admitting: Physician Assistant

## 2023-06-10 ENCOUNTER — Encounter: Payer: Self-pay | Admitting: Physician Assistant

## 2023-06-11 ENCOUNTER — Other Ambulatory Visit: Payer: Self-pay | Admitting: Physician Assistant

## 2023-06-11 DIAGNOSIS — E7800 Pure hypercholesterolemia, unspecified: Secondary | ICD-10-CM

## 2023-06-11 MED ORDER — ATORVASTATIN CALCIUM 10 MG PO TABS: 10.0000 mg | ORAL_TABLET | Freq: Every evening | ORAL | 1 refills | Status: DC

## 2023-09-06 ENCOUNTER — Other Ambulatory Visit: Payer: Self-pay | Admitting: Physician Assistant

## 2023-11-30 ENCOUNTER — Ambulatory Visit: Payer: Medicare HMO | Admitting: Physician Assistant

## 2023-12-08 ENCOUNTER — Other Ambulatory Visit: Payer: Self-pay | Admitting: Physician Assistant

## 2023-12-08 DIAGNOSIS — E7800 Pure hypercholesterolemia, unspecified: Secondary | ICD-10-CM

## 2024-05-08 ENCOUNTER — Other Ambulatory Visit: Payer: Self-pay | Admitting: Physician Assistant

## 2024-05-08 DIAGNOSIS — E7800 Pure hypercholesterolemia, unspecified: Secondary | ICD-10-CM

## 2024-05-17 ENCOUNTER — Other Ambulatory Visit: Payer: Self-pay | Admitting: Physician Assistant

## 2024-06-23 ENCOUNTER — Other Ambulatory Visit: Payer: Self-pay | Admitting: Physician Assistant

## 2024-06-23 NOTE — Telephone Encounter (Signed)
 Last OV 06/01/23 Next OV not scheduled  Last refill 05/19/24 Qty #30/0  Pt has not been seen in > 1 year, please call to assist with scheduling follow-up visit.

## 2024-08-12 ENCOUNTER — Other Ambulatory Visit: Payer: Self-pay | Admitting: Physician Assistant

## 2024-08-12 DIAGNOSIS — E7800 Pure hypercholesterolemia, unspecified: Secondary | ICD-10-CM

## 2024-09-10 ENCOUNTER — Other Ambulatory Visit: Payer: Self-pay | Admitting: Physician Assistant

## 2024-09-10 DIAGNOSIS — E7800 Pure hypercholesterolemia, unspecified: Secondary | ICD-10-CM
